# Patient Record
Sex: Male | Born: 1945 | Race: White | Hispanic: No | Smoking: Never smoker
Health system: Southern US, Community
[De-identification: ages and names within clinical notes are randomized; demographics above are authoritative.]

## PROBLEM LIST (undated history)

## (undated) DIAGNOSIS — K219 Gastro-esophageal reflux disease without esophagitis: Secondary | ICD-10-CM

## (undated) DIAGNOSIS — I219 Acute myocardial infarction, unspecified: Secondary | ICD-10-CM

## (undated) DIAGNOSIS — F329 Major depressive disorder, single episode, unspecified: Secondary | ICD-10-CM

## (undated) DIAGNOSIS — R011 Cardiac murmur, unspecified: Secondary | ICD-10-CM

## (undated) DIAGNOSIS — F419 Anxiety disorder, unspecified: Secondary | ICD-10-CM

## (undated) DIAGNOSIS — E785 Hyperlipidemia, unspecified: Secondary | ICD-10-CM

## (undated) DIAGNOSIS — M4800 Spinal stenosis, site unspecified: Secondary | ICD-10-CM

## (undated) DIAGNOSIS — I639 Cerebral infarction, unspecified: Secondary | ICD-10-CM

## (undated) DIAGNOSIS — I10 Essential (primary) hypertension: Secondary | ICD-10-CM

## (undated) DIAGNOSIS — G629 Polyneuropathy, unspecified: Secondary | ICD-10-CM

## (undated) DIAGNOSIS — F32A Depression, unspecified: Secondary | ICD-10-CM

## (undated) DIAGNOSIS — E119 Type 2 diabetes mellitus without complications: Secondary | ICD-10-CM

## (undated) DIAGNOSIS — R42 Dizziness and giddiness: Secondary | ICD-10-CM

## (undated) DIAGNOSIS — K449 Diaphragmatic hernia without obstruction or gangrene: Secondary | ICD-10-CM

## (undated) HISTORY — DX: Gastro-esophageal reflux disease without esophagitis: K21.9

## (undated) HISTORY — DX: Dizziness and giddiness: R42

## (undated) HISTORY — DX: Hyperlipidemia, unspecified: E78.5

## (undated) HISTORY — DX: Cerebral infarction, unspecified: I63.9

## (undated) HISTORY — DX: Anxiety disorder, unspecified: F41.9

## (undated) HISTORY — DX: Type 2 diabetes mellitus without complications: E11.9

## (undated) HISTORY — DX: Depression, unspecified: F32.A

## (undated) HISTORY — DX: Acute myocardial infarction, unspecified: I21.9

## (undated) HISTORY — DX: Major depressive disorder, single episode, unspecified: F32.9

## (undated) HISTORY — DX: Cardiac murmur, unspecified: R01.1

## (undated) HISTORY — DX: Essential (primary) hypertension: I10

## (undated) HISTORY — DX: Polyneuropathy, unspecified: G62.9

## (undated) HISTORY — DX: Spinal stenosis, site unspecified: M48.00

---

## 1978-04-27 HISTORY — PX: APPENDECTOMY: SHX54

## 2001-04-21 ENCOUNTER — Emergency Department (HOSPITAL_COMMUNITY): Admission: EM | Admit: 2001-04-21 | Discharge: 2001-04-21 | Payer: Self-pay | Admitting: Emergency Medicine

## 2006-04-27 DIAGNOSIS — I219 Acute myocardial infarction, unspecified: Secondary | ICD-10-CM

## 2006-04-27 DIAGNOSIS — I639 Cerebral infarction, unspecified: Secondary | ICD-10-CM

## 2006-04-27 HISTORY — DX: Cerebral infarction, unspecified: I63.9

## 2006-04-27 HISTORY — DX: Acute myocardial infarction, unspecified: I21.9

## 2006-10-14 ENCOUNTER — Inpatient Hospital Stay (HOSPITAL_COMMUNITY): Admission: EM | Admit: 2006-10-14 | Discharge: 2006-10-15 | Payer: Self-pay | Admitting: Emergency Medicine

## 2006-10-14 ENCOUNTER — Encounter (INDEPENDENT_AMBULATORY_CARE_PROVIDER_SITE_OTHER): Payer: Self-pay | Admitting: Neurology

## 2006-10-15 ENCOUNTER — Ambulatory Visit: Payer: Self-pay | Admitting: Vascular Surgery

## 2006-10-15 ENCOUNTER — Encounter (INDEPENDENT_AMBULATORY_CARE_PROVIDER_SITE_OTHER): Payer: Self-pay | Admitting: Neurology

## 2006-11-06 ENCOUNTER — Inpatient Hospital Stay (HOSPITAL_COMMUNITY): Admission: EM | Admit: 2006-11-06 | Discharge: 2006-11-08 | Payer: Self-pay | Admitting: Emergency Medicine

## 2006-11-10 ENCOUNTER — Inpatient Hospital Stay (HOSPITAL_COMMUNITY): Admission: EM | Admit: 2006-11-10 | Discharge: 2006-11-13 | Payer: Self-pay | Admitting: Emergency Medicine

## 2006-11-10 ENCOUNTER — Ambulatory Visit: Payer: Self-pay | Admitting: Cardiology

## 2007-01-12 ENCOUNTER — Ambulatory Visit: Payer: Self-pay | Admitting: Cardiology

## 2007-08-31 ENCOUNTER — Ambulatory Visit: Payer: Self-pay | Admitting: Cardiology

## 2007-09-15 ENCOUNTER — Ambulatory Visit: Payer: Self-pay

## 2009-06-20 IMAGING — CT CT HEAD W/O CM
1 of 2 series · 13 of 30 positions shown, 17 images · IV contrast (agent unspecified)
Comparison: 11/06/06

CLINICAL DATA: 60 year-old with seizure.  Stroke.  Recurrent right-sided weakness and tremors.  Slurred speech.  Complains of headaches, TIA on [REDACTED].
HEAD CT WITHOUT CONTRAST:
TECHNIQUE: Contiguous axial images were obtained from the base of the skull through the vertex according to standard protocol without contrast.

[Series 2: brain · axial · 0.47mm/px · z∈[+123,+260]mm · 13 of 32 slices shown, 17 images]
[im 3/32  brain]
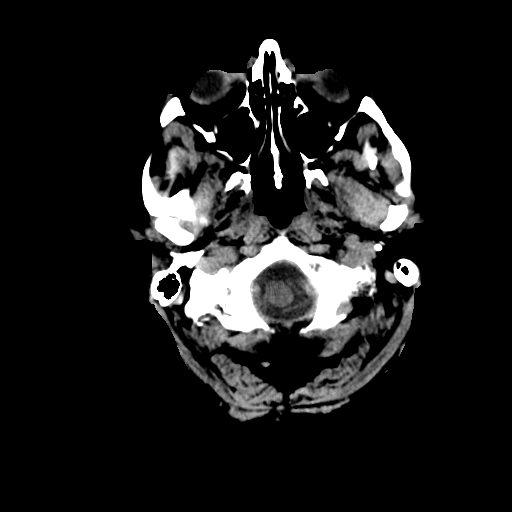
[im 3/32  bone]
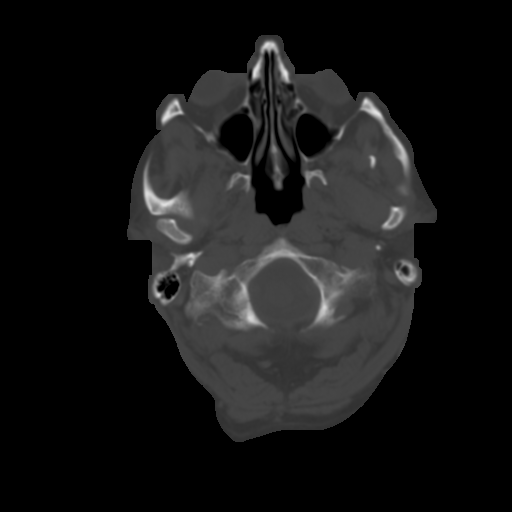
[im 5/32  brain]
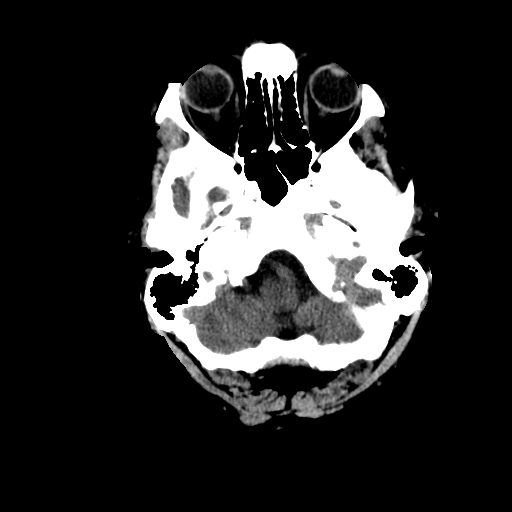
[im 7/32  brain]
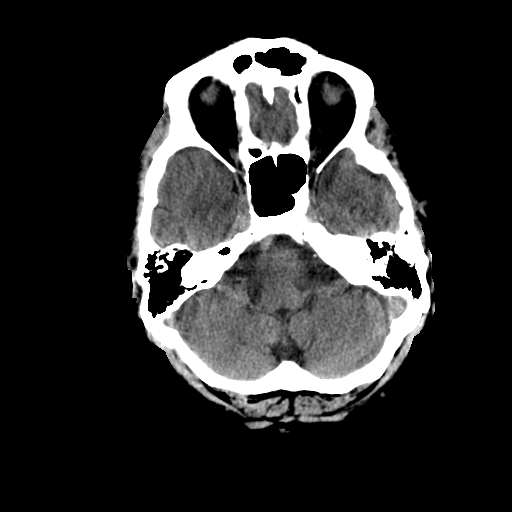
[im 9/32  brain]
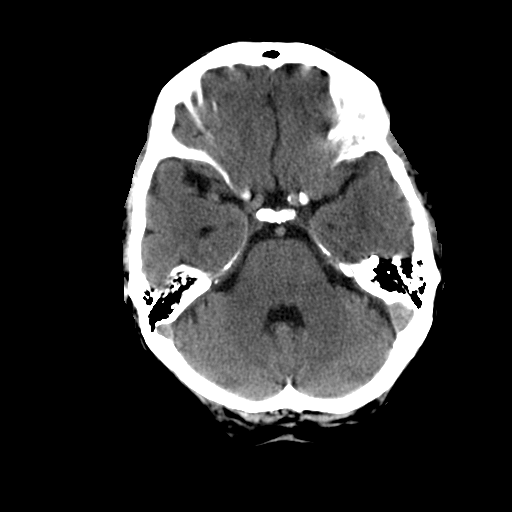
[im 12/32  brain]
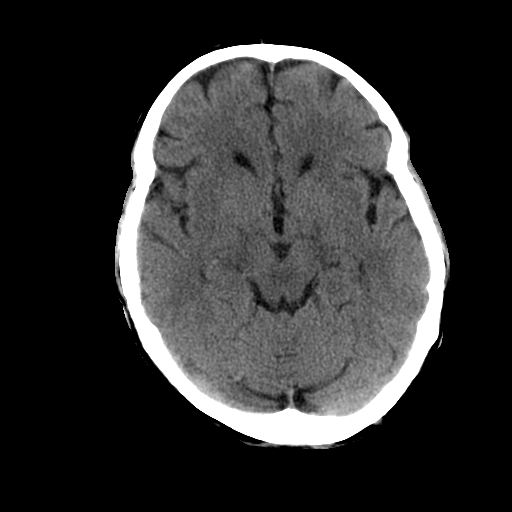
[im 12/32  bone]
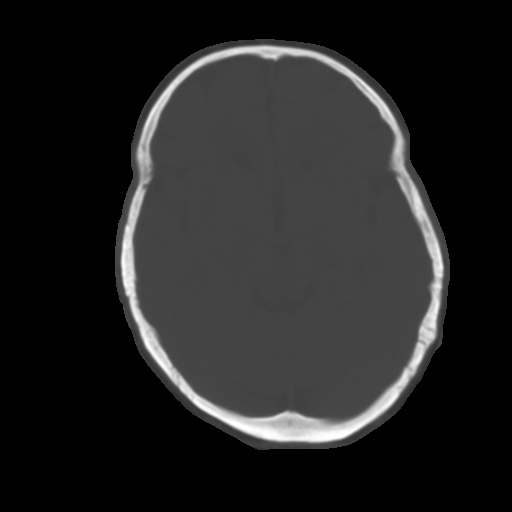
[im 14/32  brain]
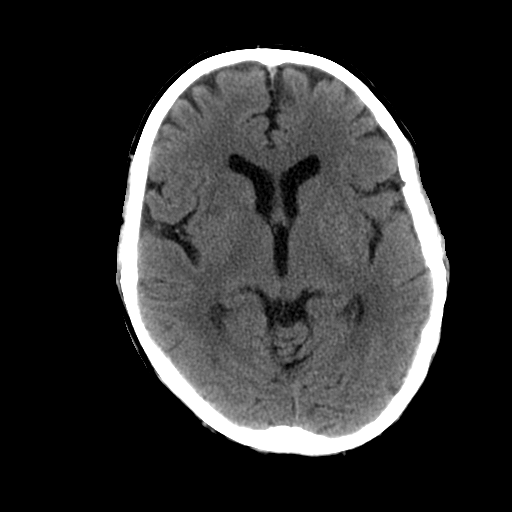
[im 16/32  brain]
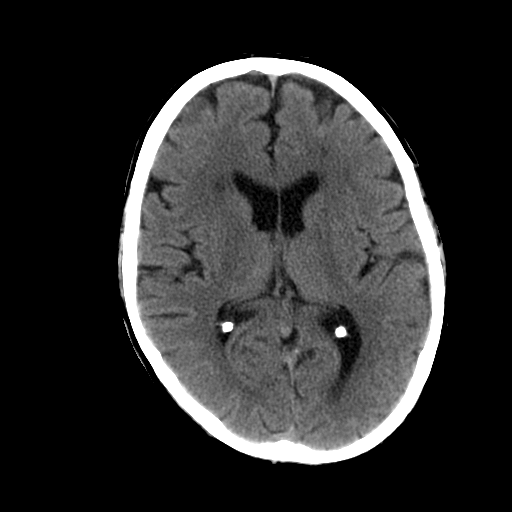
[im 18/32  brain]
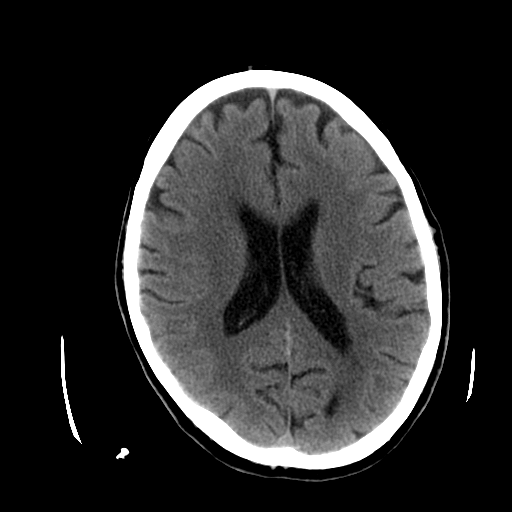
[im 20/32  brain]
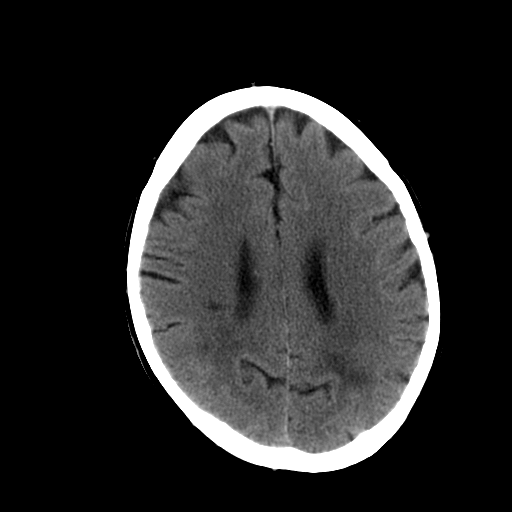
[im 20/32  bone]
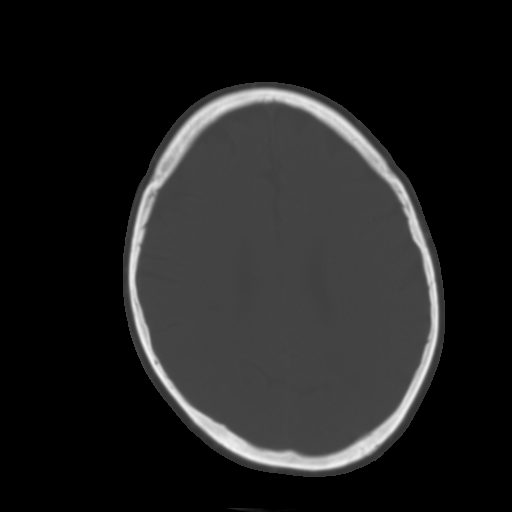
[im 23/32  brain]
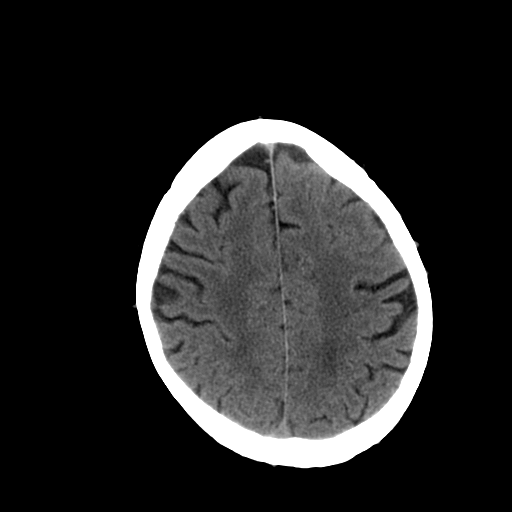
[im 25/32  brain]
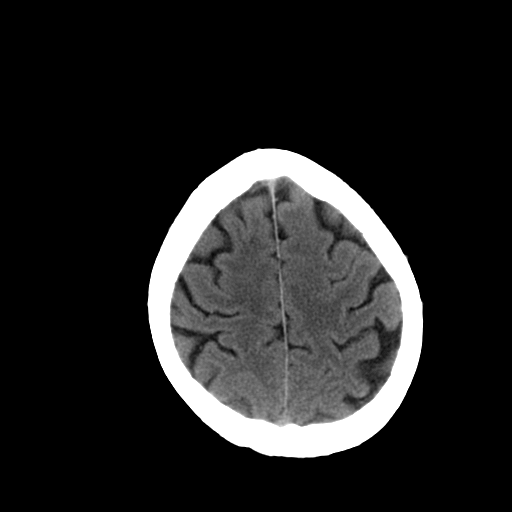
[im 27/32  brain]
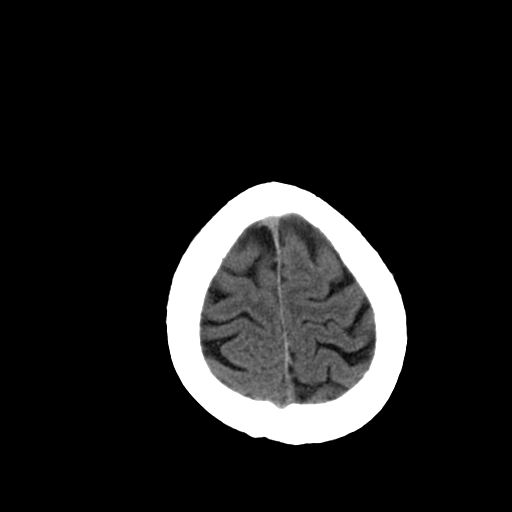
[im 29/32  brain]
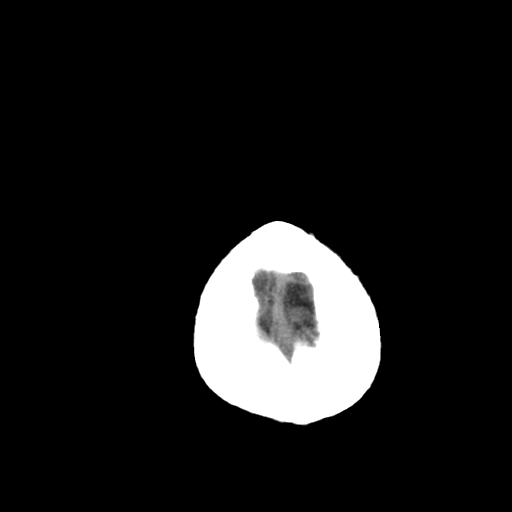
[im 29/32  bone]
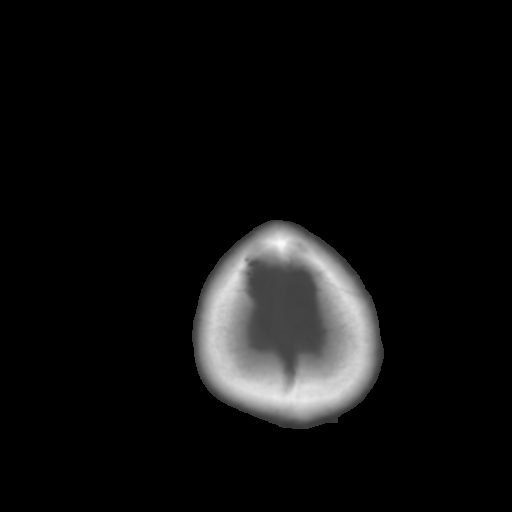

[13 of 30 positions shown; findings below may reference images not displayed]

FINDINGS: There is no intra- or extraaxial fluid collection or mass.  The basilar cisterns and ventricles have a normal appearance.  There are periventricular white matter changes.  Old infarcts are seen within the white matter within the occipital lobe on the left and the parietal lobe on the right.  Low density is seen within the right external capsule, chronic.  o CT evidence for acute infarction.
IMPRESSION: 1.  Chronic changes.  
2.  No evidence for acute intracranial abnormality.

## 2010-05-17 ENCOUNTER — Encounter: Payer: Self-pay | Admitting: Neurology

## 2010-09-09 NOTE — Discharge Summary (Signed)
NAMEKYON, Albert [name] NO.:  [id number]   MEDICAL RECORD NO.:  [id number]          PATIENT TYPE:  INP   LOCATION:  3707                         FACILITY:  [facility]   PHYSICIAN:  Evie Lacks, MD       DATE OF BIRTH:  1946-02-07   DATE OF ADMISSION:  10/14/2006  DATE OF DISCHARGE:                               DISCHARGE SUMMARY   DISCHARGE DIAGNOSES:  1. Left brain transient ischemic attack.  2. Dyslipidemia.  3. Hypertension x 6 years.  4. Essential tremor.  5. Appendectomy 1980.   DISCHARGE MEDICATIONS:  1. Benicar 20 mg a day.  2. Aspirin 325 mg a day.  3. Inderal 20 mg 3 times a day.  4. Zocor 20 mg q.h.s.  5. Hydrochlorothiazide 12.5 mg a day.   STUDIES PERFORMED.:  1. CT of the brain on admission showed no acute abnormalities.      Atrophy with age-advanced small vessel disease and vertebral artery      atherosclerosis.  Subtle possibly low density and right subicular      region.  2. MRI of the brain shows no acute abnormalities.  Chronic ischemic      changes in the white matter.  3. MRA of the head is negative.  4. Chest x-ray shows borderline cardiomegaly.  5. EKG shows normal sinus rhythm.  6. A 2-D echocardiogram shows EF of 55-60%, with no left ventricular      regional wall motion abnormalities.  No embolic source.  7. Carotid Doppler shows no ICA stenosis.  8. EEG shows no seizure activity.   LABORATORY STUDIES:  Cardiac enzymes negative x2.  Cholesterol 169,  triglycerides 102, HDL 38, LDL 111.  Urinalysis negative.  Homocysteine  pending.  Alcohol level less than 5.  Urine drug screen negative.  Hemoglobin A1c 6.1.  Code stroke panel with CBC normal diff,  normal  chemistry with potassium 3.1, glucose 265, albumin 2.5.  Liver function  tests otherwise normal state.  CK 157, CK-MB 4.6, and troponin I of  0.12.   HISTORY OF PRESENT ILLNESS:  This is the first [place] admission for  Mr. Summer Parthasarathy, a 65 year old, right-handed,  divorced, Caucasian male  from [place], [place], who was seen in the emergency room for a  code stroke.  He has a known 6-year history of hypertension.  He does  not smoke cigarettes and has no known risk factors of diabetes or  coronary artery disease.  He has a family history of coronary artery  disease.  Over the last 6 months, he has noted some difficulty with  recurrent chest pain and has had some vague symptoms in his right hand  and arm.  He has been seen by Dr. Anne Hahn in the past for this as well  and has an essential tremor for which he takes propranolol 20 b.i.d.  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  He was there Monday,  Tuesday, and Wednesday for an evaluation.  There he was noted to have a  high blood pressure of 170/20 for 2 days and that decreased to 160/101  yesterday.  He rented a car and drove himself home.  He was in Dr.  Dorinda Hill Moore's office at approximately 9:30 the morning of admission  when he noticed a blood pressure 160/90 and the onset of numbness  involving the right side of his face and upper shoulder.  This was  associated with speech difficulties.  He was seen by Dr. Christell Constant and a  Code Stroke was called.  He was brought by ambulance to [facility]  emergency room where he had speech difficulties as well as numbness on  his right side.  He had no other significant signs or symptoms.  His NIH  stroke scale was 2.  He was not a t-PA candidate secondary to mild  symptom and quick resolution.  He was admitted to the hospital for  further stroke workup.   HOSPITAL COURSE:  MRI was negative for acute stroke.  He was found to  have risk factors of mild dyslipidemia with an LDL of 111.  He was  started on 20 mg of Zocor for this.  The patient also complained of  headache and his blood pressure was slightly elevated in the 150s over  90s in the hospital.  He was started on  hydrochlorothiazide 12.5 daily  as well as his Inderal was increased to t.i.d. which will also happens  tremors as well as blood pressure.  His stroke workup was otherwise  negative.  He had no physical or neurological disabilities and was felt  safe to discharge home.   CONDITION ON DISCHARGE:  The patient alert, oriented x3.  Follows  commands.  Visual fields full.  Moves all extremities.  Does have  essential tremor.   DISCHARGE/PLAN:  1. Discharge home with family.  2. Aspirin for stroke prevention.  3. New Statin for elevated LDL.  4. Will need followup with Dr. Christell Constant within 4-6 weeks.  5. New hydrochlorothiazide for slightly elevated blood pressure.  6. Increase propranolol to 20 t.i.d. for essential tremor.   FOLLOW UP:  Dr. Christell Constant within 1 month.   FOLLOW UP:  Dr. Melbourne Abts in 2-3 months.      Annie Main, N.P.    ______________________________  Evie Lacks, MD    SB/MEDQ  D:  10/15/2006  T:  10/15/2006  Job:  [id number]   cc:   Ernestina Penna, M.D.

## 2010-09-09 NOTE — Discharge Summary (Signed)
NAME:  Albert [name], Albert [name]                 ACCOUNT NO.:  [id number]   MEDICAL RECORD NO.:  [id number]          PATIENT TYPE:  INP   LOCATION:  3035                         FACILITY:  [facility]   PHYSICIAN:  Pramod P. Pearlean Brownie, MD    DATE OF BIRTH:  05-Jan-1946   DATE OF ADMISSION:  11/06/2006  DATE OF DISCHARGE:  11/08/2006                               DISCHARGE SUMMARY   DIAGNOSES AT TIME OF DISCHARGE:  1. Likely migraine with underlying psychosocial stresses.  Doubt      transient ischemic attack.  2. Hypertension.  3. Benign essential tremor.  4. History of smoking.  5. Possible transient ischemic attack in June of 2008, though      conversion reaction preferred.  6. Dyslipidemia.  7. History of appendectomy.  8. Questionable seizure per family history.  Onset of stroke symptoms      July 2008.   MEDICATIONS AT TIME OF DISCHARGE:  1. Benicar 20 mg a day.  2. Aspirin 325 mg a day.  3. Inderal 20 mg three times a day.  4. Zocor 20 mg at bedtime.  5. Hydrochlorothiazide 12.5 mg a day.   STUDIES PERFORMED:  1. CT of the brain shows no acute abnormality.  2. Chest x-ray negative.  3. Hip x-ray negative.  4. Right shoulder x-ray negative.  5. CT of the head with 24-hour follow-up shows chronic changes; no      acute abnormality.  6. Telemetry shows sinus bradycardia.  7. MRI negative for acute stroke.  8. MRA with minor atherosclerotic changes.   LABORATORY STUDIES:  CBC normal.  Chemistry normal.  Coagulations  normal.  Liver function tests normal.  Cardiac enzymes negative.  Alcohol level less than 5.   HISTORY OF PRESENT ILLNESS:  Albert [name] is a 65 year old right-handed  Caucasian male who was discharged from [facility] on October 15, 2006 for  right-sided numbness and weakness possibly secondary to TIA, though most  likely conversion reaction.  He was brought into the emergency room by  EMS for a code stroke.  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  The exact nature of the seizure was  unclear, but when EMS arrived, the patient was not moving his right  side.  He was brought to the emergency room and a Code Stroke was called  in 7:40.  The patient was taken to a CT scanner at 8:00 a.m., and  neurologic evaluation was done at 8:05.  CT results were also known at  8:05 which did not show any changes.  By the time of the initial  evaluation, the patient was able to partially move his right side with  slowly regaining his speech.  He did not recollect any episode, but  complained of headache, right shoulder and leg pain and tremulousness.  He  denied chest pain, shortness of breath.  He was oriented to person,  place and time and could follow commands.   On further questioning of the family within the next 20 minutes, history  was obtained that was different from original history where they stated  the patient was at a baby shower at his granddaughter's place where he  was sitting in a chair watching TV.  His last witnessed normalcy was at  5:45 p.m. on November 06, 2006, but nobody noticed him to be moving his arms  or legs.  At 6:45 p.m., he was sitting and staring at the TV in a  similar pattern, but was unresponsive to any commands.  EMS was called.  There was a history of generalized shaking, especially in his arms more  than the legs, but no witnesses were currently available to give an  actually history.  He was admitted to the hospital for further stroke  evaluation.   HOSPITAL COURSE:  MRI was negative for acute stroke.  It is doubted that  this was a TIA secondary to recent stroke workup being unrevealing.  Possibly migraine etiology with or without underlying psychosocial  stresses and possible  conversion reaction.  EEG was ordered, but the  patient refused.  He was placed on Keppra for secondary prevention in  the hospital, but that was not needed at discharge.  Will continue  aspirin for stroke prevention and have asked him to evaluate his stress  and possible relaxation exercises.  The patient with no neurologic  deficits at time of discharge.   CONDITION AT DISCHARGE:  Neurologic exam normal.   DISCHARGE PLAN:  1. Discharge home with family.  2. Continue aspirin for stroke prevention.  3. Follow up with Dr. Rudi Heap for risk factor control including      stress alleviation.  4. Neurologic follow-up only as needed.      Annie Main, N.P.    ______________________________  Sunny Schlein. Pearlean Brownie, MD    SB/MEDQ  D:  11/08/2006  T:  11/08/2006  Job:  [id number]   cc:   Albert [name], M.D.

## 2010-09-09 NOTE — Consult Note (Signed)
NAMEABDALLAH, Albert [name] NO.:  [id number]   MEDICAL RECORD NO.:  [id number]          PATIENT TYPE:  INP   LOCATION:  2010                         FACILITY:  [facility]   PHYSICIAN:  Jesse Sans. Wall, MD, FACCDATE OF BIRTH:  [birth year]   DATE OF CONSULTATION:  11/11/2006  DATE OF DISCHARGE:                                 CONSULTATION   CARDIOLOGY CONSULTATION:   PRIMARY CARDIOLOGIST:  [place], Dr. Juanito Doom.   PRIMARY CARE PHYSICIAN:  Drs. Moore and Dr. __________.   REASON FOR CONSULTATION:  Chest pain.   HISTORY OF PRESENT ILLNESS:  This is a 65 year old Caucasian male,  looking older than stated age, who while sitting in M.D. office for  followup with primary care physician after recent discharge on July 14  for TIA and seizure activity, starting having sharp chest pains, which  he described as midsternal, 8/10, with associated shortness of breath  and mild dizziness.  He denied syncope, diaphoresis, nausea or vomiting.  He was seen by the M.D. in the office, given 4 baby aspirin and  sublingual nitroglycerin.  The pain eased off some, but remained.  EMS  called, and via EMS, the patient was given another sublingual  nitroglycerin and transported to [facility] emergency room.  On arrival  to [facility] emergency room, the patient was given a third sublingual  nitroglycerin, and pain was relieved.  The patient has had recurrent  chest discomfort since admission.  One woke him up this morning, which  he described as some mild pressure.  He also apparently experienced a  seizure last evening, staring off into space and unresponsive with  some mild tremors noted, and was seen by rapid response.  The patient  was not given any medication at that time.  EKG showed normal sinus  rhythm.  The patient was given nitroglycerin secondary to a blood  pressure 158/94.  The patient recovered after approximately 2 minutes  and was back to normal state.  He is being followed by neurology  as  well, and had seen him in the past hospitalization over the last week.  The patient is now without acute distress.   REVIEW OF SYSTEMS:  Positive for chest pain, shortness of breath, mild  dyspnea on exertion.  Also, complains of lower extremity pain on  occasions.  Otherwise, negative.   PAST MEDICAL HISTORY:  1. Questionable seizures.  2. Migraines.  3. Hypertension.  4. Dyslipidemia.  5. Benign essential tremor.   PAST SURGICAL HISTORY:  Appendectomy.   SOCIAL HISTORY:  The patient lives in [place] with his fiance.  He is a  former [employer] architect, is now disabled.  He does not smoke.  He does not  drink alcohol.  He denies any illicit drug use.  He exercises very  little, and is on no special diet.   FAMILY HISTORY:  Mother died of heart trouble.  Father died at age 57  with continued angina and heart trouble.  He has one sister who died of  colon cancer, and another sister who has currently, but is currently  alive.   CURRENT MEDICATIONS:  1. Aspirin 324 one p.o. daily.  2. Lovenox 40 mg subcu q.24 hours.  3. Protonix 40 mg once a day.  4. Albuterol inhaler 2.5 mg q.6 hours.  5. Atrovent inhaler 0.5 mg q.6 hours.  6. Avapro 150 mg a day.  7. Zocor 20 mg q.p.m.  8. Inderal 20 mg b.i.d.  9. Hydrochlorothiazide 12.5 mg daily.  10.Tylenol p.r.n.  11.Oxycodone p.r.n.  12.Nitroglycerin p.r.n.   ALLERGIES:  DARVON, causing nausea and vomiting, and PENICILLIN, causing  severe nausea.   CURRENT LABORATORY DATA:  Hemoglobin 13.3, hematocrit 38.8, white blood  cells 7.9, platelets 267.  Sodium 132, potassium 3.8, chloride 99, CO2  of 27, BUN 12, creatinine 0.82, glucose 87.  BNP 79.0.  TSH 3.145.  CK  42, MB 0.5, troponin 0.01 and 0.02 respectively.  PTT 37, PT 13.1, INR  1.0.  Cholesterol 143, lipids 85, HDL 40, LDL 76.   EKG revealing sinus bradycardia with a ventricular rate of 45 beats per  minute without evidence of acute ST-T wave abnormalities or prior  ischemic  event.   Chest x-ray dated November 10, 2006 revealing no active cardiopulmonary  disease.   MRA without contrast, stable.  Small vessel disease.  MRA with contrast,  stable, with no acute bleed.   PHYSICAL EXAMINATION:  VITAL SIGNS:  Blood pressure 113/84, pulse 67,  respirations 18, temperature 97.0, O2 sat 100% on 2 liters.  HEENT:  Head is normocephalic, atraumatic.  Eyes:  PERRLA.  Mucous  membranes and mouth:  Pink and moist.  Tongue is midline.  He is  edentulous.  NECK:  Supple.  There is no JVD.  No carotid bruits.  No thyromegaly is  noted.  CARDIOVASCULAR:  Regular rate and rhythm without murmurs, rubs or  gallops.  Pulses are 2+ and equal bilaterally.  LUNGS:  Essentially clear to auscultation with some left lower lobe  crackles noted on inspiration.  ABDOMEN:  Soft, nontender with normal bowel sounds.  No rebound or  guarding is noted.  CHEST WALL:  On palpation caused pain, which was reproducible with  sternal pressure, although this was mild pressure.  NEURO:  The patient does have a mild tremor.  He is a good historian,  and there is no slurred speech or weakness noted.   IMPRESSION:  1. Atypical chest pain, probable musculoskeletal and reproducible with      palpation.  2. Bradycardia, probably related to Inderal.  Recommend titrating down      to 10 mg b.i.d. x2 days, and then discontinue.  Neuro to find an      alternative medication not causing bradycardia.  3. Positive cardiovascular risk factors, to including hypertension,      dyslipidemia, family history and age.   PLAN:  This is a 65 year old Caucasian male, looking older than stated  age, with complaints of sharp substernal chest pressure with associated  shortness of breath in physician's office after a followup appointment  for a recent hospitalization for seizures and TIA.  The patient has been  seen and examined by myself and Dr. Juanito Doom.  Our plan will be:  1. To decrease his Inderal secondary to  bradycardia, and have neuro      find another migraine medication.  2. The patient will be followed as an outpatient for a stress Myoview      once he has been cleared by neuro for other reasons for seizures.      The patient  does have cardiovascular risk factors, but his blood      pressure and cholesterol appear to be stable.  An EKG is normal      with the exception of bradycardia, and troponins are also normal.   We will follow the patient along with you, and thank you for the  recommendation.  Based on hospital course and the patient's response, at  this time, no further cardiac evaluation is necessary and can be done as  an outpatient.      Bettey Mare. Lyman Bishop, NP      Jesse Sans. Daleen Squibb, MD, [facility]  Electronically Signed    KML/MEDQ  D:  11/11/2006  T:  11/11/2006  Job:  [id number]   cc:   Ernestina Penna, M.D.

## 2010-09-09 NOTE — H&P (Signed)
[name], HARTLAND NO.:  [id number]   MEDICAL RECORD NO.:  [id number]          PATIENT TYPE:  INP   LOCATION:  2010                         FACILITY:  [facility]   PHYSICIAN:  Lonia Blood, M.D.      DATE OF BIRTH:  [birth year]   DATE OF ADMISSION:  11/10/2006  DATE OF DISCHARGE:                              HISTORY & PHYSICAL   PRIMARY CARE PHYSICIAN:  Dr. Rudi Heap at [facility].   PRESENTING COMPLAINT:  Chest pain.   HISTORY OF PRESENT ILLNESS:  Patient is a 65 year old gentleman who was  just discharged 2 days ago with diagnosis of TIA and possible migraine  headaches from the hospital.  He went to follow up with Dr. Christell Constant today  and had a chest pain.  Patient described the chest pain as centrally  located.  It lasted a few minutes and then returned.  He did not take  any nitroglycerin until arriving in the ER.  He denied any diaphoresis.  No radiation.  Denied any prior chest pain.  Patient reported that he  has been told that he had a previous MI, but no records available.  He  was referred and his primary care physician sent him to the ER for  further workup.  Patient's workup has, so far, been negative for acute  MI; however, he has risk factors for cardiac disease, including  hypertension, dyslipidemia, prior TIAs and male sex.  We are  subsequently called to admit patient for further workup.   PAST MEDICAL HISTORY:  Significant for:  1. Multiple transient ischemic attacks, at least twice.  2. Hypertension.  3. Dyslipidemia.  4. Benign essential tremors.  5. Status post appendectomy.  6. 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  7. Questionable seizure mainly historically.   ALLERGIES:  HE IS ALLERGIC TO:  1. PENICILLIN.  2. CODEINE.   MEDICATIONS:  Include:  1. Benicar 20 mg daily.  2. Aspirin 325 mg daily.  3. Inderal 20 mg t.i.d.  4. Zocor 20 mg daily.  5. Hydrochlorothiazide 12.5 mg daily.   SOCIAL HISTORY:  Patient is  married and lives with his wife in [place].  Patient denied ever smoking.  Social alcohol, although previously drank  a little bit more than normal.  Denied any IV drug use.   FAMILY HISTORY:  Patient reported possible family history in his father,  where he has no recollection.   REVIEW OF SYSTEMS:  A 12-point review of systems performed is negative,  except per HPI.   PHYSICAL EXAMINATION:  VITAL SIGNS:  He is afebrile with a temperature  of 97.0.  Blood pressure 139/81.  Pulse 50-52.  Respiratory rate 24.  Saturations 98% on room air.  GENERALLY:  He is an awake, alert and pleasant man in no acute distress.  HEENT:  PERRL.  EOMI.  NECK:  Supple.  No JVD.  No  lymphadenopathy.  RESPIRATORY:  Patient has good air entry bilaterally.  No wheezes.  No  rales.  CARDIOVASCULAR SYSTEM:  He has S1, S2.  No murmurs.  ABDOMEN:  Soft, nontender with  positive bowel sounds.  EXTREMITIES:  Show no edema, cyanosis or clubbing.  NEURO EXAM:  Essentially within normal limits.   LABORATORY DATA:  Sodium 138, potassium 4.3, chloride 108, BUN 14,  glucose 96, bicarb of 26.8, creatinine 1.0.  White count 7.9, hemoglobin  13.3, platelet count 267.  Initial cardiac enzymes all negative.  Chest  x-ray showed no active cardiopulmonary disease.  EKG showed sinus  bradycardia with a rate of 45.  No ST/T wave changes.  No change from  prior EKG on July 12.   ASSESSMENT:  This is a 65 year old gentleman with risk factors for  cardiac disease, presenting with what appears to be noncardiac chest  pain.  Due to patient's risk factors, however, we will admit the patient  for further evaluation and rule out myocardial infarction.   PLAN:  1. Chest pain.  As indicated, we will admit the patient to telemetry      bed.  We will check serial cardiac enzymes.  We will continue with      his home medications.  We will give him nitroglycerin as needed for      return of chest pain.  Oxygen, morphine or Dilaudid as  needed.  If      patient's enzymes x3 were negative, we will refer him back to      cardiology for stress testing and further risk stratification.  2. Hypertension.  Blood pressure seems to be controlled on his home      medication.  We will continue with that as much as possible.  3. Sinus bradycardia.  Patient's sinus bradycardia seems to be severe      and he is on Inderal.  I will change his Inderal to b.i.d. at this      point and probably even hold completely if he becomes symptomatic.  4. Benign essential tremor.  Again, I will continue with the Inderal,      but only b.i.d. due to his sinus bradycardia, although he has not      been very much symptomatic at this point.  5. Dyslipidemia.  I will continue with his Zocor while in the      hospital.  Otherwise, further treatment will depend on his response      while in the hospital.      Lonia Blood, M.D.  Electronically Signed     LG/MEDQ  D:  11/10/2006  T:  11/11/2006  Job:  [id number]

## 2010-09-09 NOTE — Procedures (Signed)
EEG NUMBER:  [id number]   REQUESTING PHYSICIAN:  Dr. Avie Echevaria.   CLINICAL HISTORY:  This is a routine EEG done with photic stimulation  but not hyperventilation.  The patient is described as awake and asleep.  This is a 65 year old male with right-sided numbness and right-sided  headache, as well as speech problems.  EEG is for evaluation of possible  seizure.   DESCRIPTION:  The dominant rhythms tracing is a moderate amplitude alpha  rhythm of 10 to 11 Hz, which predominates posteriorly, appears without  abnormal asymmetry, and attenuates with eye opening and closing.  Fairly  abundant low-amplitude fast activity is seen frontally and centrally and  appears without abnormal asymmetry.  No focal slowing is noted.  No  epileptiform discharges seen.  Early in the recording, sleep is recorded  as evidenced by appearance of vertex waves and sleep spindles.  No  abnormalities are seen in the sleep state.  Photic stimulation produced  just driving responses.  Hyperventilation was not performed.  A single  channel devoted EKG revealed marked sinus bradycardia throughout, with a  rate of approximately 48 beats per minute.   CONCLUSIONS:  Marked sinus bradycardia incidentally noted on EKG  tracing.  Otherwise normal study in the awake and sleep states.      Michael L. Thad Ranger, M.D.  Electronically Signed     [id number]  D:  10/15/2006 17:18:28  T:  10/16/2006 11:04:27  Job #:  [id number]

## 2010-09-09 NOTE — Procedures (Signed)
EEG [id number].   CLINICAL HISTORY:  The patient is a 65 year old with a history of  syncope and possible seizure activity.  The study is being done to look  for the presence of seizures.  He has had stroke-like behaviors without  MRI findings (780.2).   PROCEDURE:  The tracing is carried out on a 32 channel digital Cadwell  recorder, reformatted into 16 channel montages with one devoted to EKG.  The patient was awake and drowsy.  The International 10-20 system lead  placement was used.  Medications include morphine, heparin, Ventolin,  Atrovent, Avapro, Zocor, Inderal, hydrochlorothiazide and Benicar.   DESCRIPTION OF FINDINGS:  Dominant frequency is a 10 Hz 15 to 30  microvolt alpha range activity.  When the patient is fully awake, the  background is basically alpha and beta range components.  With  drowsiness, mixed frequency theta activity of 30 to 35 microvolts was  seen.  The patient did not drift into natural sleep.  With arousal, he  returns to a waking rhythm.  Photic stimulation induced a driving  response at 7, 9 and 11 Hz.  Hyperventilation was not carried out.   EKG showed a sinus bradycardia with ventricular response of 48 beats per  minute.   IMPRESSION:  Normal EEG with the patient awake and drowsy.      Deanna Artis. Sharene Skeans, M.D.  Electronically Signed     ZOX:WRUE  D:  11/11/2006 20:48:41  T:  11/12/2006 14:39:02  Job #:  [id number]   cc:   Lonia Blood, M.D.

## 2010-09-09 NOTE — Consult Note (Signed)
NAMEBRODRIC, SCHAUER NO.:  [id number]   MEDICAL RECORD NO.:  [id number]          PATIENT TYPE:  INP   LOCATION:  2010                         FACILITY:  [facility]   PHYSICIAN:  Deanna Artis. Hickling, M.D.DATE OF BIRTH:  March 04, 1946   DATE OF CONSULTATION:  11/11/2006  DATE OF DISCHARGE:                                 CONSULTATION   CHIEF COMPLAINT:  Possible seizure activity.   HISTORY OF THE PRESENT CONDITION:  Albert [name] is a 65 year old  gentleman known to our service from two consecutive hospitalizations  June 19 and 20, 2008 and July 12-14, 2008. During the first  hospitalization, the patient complained of numbness involving the right  side of his face and upper shoulder associated with speech difficulties.  He had a blood pressure of 160/90 and was transferred from Dr. Varney Baas office to [facility] where his symptoms began to  subside.   Dr. Sandria Manly evaluated him and noted that the patient had evidence of  significant small vessel white matter disease involving the left greater  than right parietal regions.  It was thought that he might have  previously had a left brain stroke, although this was unknown.   The patient had a thorough evaluation including MRI and MRA which failed  to show evidence of acute stroke or significant hemodynamically  significant occlusions.  His 2-D echocardiogram and carotid Doppler  similarly failed to show a source for stroke.  EEG was carried out and  was negative.  We did not have a history of previous seizures in this  patient.  Stroke panel revealed mild dyslipidemia with an LDL of 111. He  was started on Zocor 20 mg. He also had a mildly elevated blood pressure  and was placed on hydrochlorothiazide 12.5 mg. The patient was on  Inderal for essential tremor and this was increased to 20 mg 3 times  daily.  He was discharged home on aspirin, statin, hydrochlorothiazide  and propranolol.   The patient presented  to the emergency room on July 12 with complaints  of seizure.  The history was conflicting that he was at a baby shower at  his granddaughter's sitting in a chair watching TV. He was last known to  be normal at 5:45 p.m. At 6:45 he was sitting staring at the TV in a  similar pattern but unresponsive to commands and apparently had some  generalized shaking especially in his arms rather than legs, however, it  is not clear whether this was his tremor or was a seizure.   He was admitted to the hospital for observation.  His workup included CT  scan of the brain, repeat MRI and MRA, x-ray of the right shoulder, hip  and chest.  Plans were made to perform an EEG, but the patient refused.  He had been placed on Keppra for seizure prevention but this was  discontinued.  It was thought that he might have a functional problem  and therefore he was not sent home on antiepileptic medicines.   The patient was admitted last night with complaints of chest  pain.  He  had an EKG showing bradycardia in the 40s but has otherwise been  asymptomatic.  He was found in the chair by his bed confused and  disoriented to the situation.  He has no memory for the event. The rapid  response team was called, his EKG was in sinus rhythm, cardiac enzymes  were unremarkable.  He was given nitroglycerin for a blood pressure  154/94. His blood pressure dropped to 115/70. He complained of a  headache.  On arrival, the patient was disoriented and confused. He  could state his name and where he was but he could not tell me why he  was in the hospital.  He then complained of crushing chest pain, 7/10  and was treated with a nitroglycerin as noted above.  By 2003 hours, the  patient was alert and oriented x3 with no signs of postictal depression.   Nursing note suggests that the patient had severe tremors and  unresponsiveness.  The nursing note mentioned that he had  questionable  seizures 2-3 minutes after the nurses  arrival in the room.  Again is not  clear to me whether what was being seen was a patient that was poorly  responsive with essential tremor.  In this setting, we were asked to see  the patient to sort out his condition and make recommendations for  further workup and treatment.   The patient has been seen by the [facility] cardiology team and they  believed that his bradycardia is directly related to Inderal and has  recommended tapering and discontinuing it.  This may cause significant  problems for his essential tremor.   REVIEW OF SYSTEMS:  Remarkable for headaches which could be migrainous  in nature.  They are left frontal associated with pounding pain, some  nausea,  sensory to light.  He says that he has had them intermittently  over the past couple of weeks.  They do not seem to be any different on  higher doses of propranolol.   PAST MEDICAL HISTORY:  As noted above for hypertension, dyslipidemia and  benign essential tremor.  It is not clear that he has had seizures nor  that his headaches are definitely migrainous in nature.   REVIEW OF SYSTEMS:  Positive for chest pain, shortness of breath,  dyspnea on exertion, palpitations and dizziness.  The patient has  heaviness in his legs.  He has some pain in his legs.  He denies fever,  chills, sweats, change in weight. He denies coryza, hematemesis, stiff  neck or other symptoms related to head and neck disease. He has not had  nausea, vomiting, or diarrhea. No frequency, urgency, dysuria, or  hematuria. No diabetes or thyroid disease, no allergic or any  conditions. Neurologic review of systems see above. No other symptoms  except those noted in the history of the present illness. A 12-system  review is otherwise negative.   PAST SURGICAL HISTORY:  Appendectomy.   FAMILY HISTORY:  His mother died of heart trouble.  Father had angina,  heart trouble and died in his 81s. One of his sisters died of colon  cancer, another is living  with cancer that he thinks maybe colon but he  does not know.   SOCIAL HISTORY:  The patient lives in [place]. He has a fiance who has a  seizure disorder and is a patient in our practice. He is sedentary, he  does not use tobacco.  He used to drink in the past but  does not drink  now.  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   CURRENT MEDICATIONS:  1. Aspirin 325 mg daily.  2. Lovenox 40 mg every 24 hours.  3. Protonix 40 mg daily.  4. Albuterol 2.5 mg every 6 hours.  5. Atrovent 0.5 mg every 6 hours.  6. Avapro 150 mg daily.  7. Zocor 20 mg daily.  8. Inderal has been dropped to 20 mg twice a day and is on its way to      be tapered.  9. Hydrochlorothiazide 12.5 mg daily.   P.R.N. MEDICINES:  Tylenol, oxycodone and nitroglycerin.   DRUG ALLERGIES:  None known. He has an intolerance to Darvon and  penicillin, both of which cause nausea. The Darvon also causes vomiting.   PHYSICAL EXAMINATION:  On examination today, this is a pleasant  gentleman in no acute distress.  Temperature 97.1, resting pulse 67, respirations 18, blood pressure  113/84, oxygen saturation 98% on 2 liters.  HEENT:  No signs of infections.  He has wax in his left external  auditory canal.  I cannot see his tympanic membranes.  NECK:  He has a supple neck, full range of motion.  No cranial or  cervical bruits.  LUNGS:  Clear to auscultation.  HEART:  No murmurs.  Pulses normal.  ABDOMEN:  Soft, nontender.  Bowel sounds normal.  EXTREMITIES:  Without edema.  NEUROLOGIC:  Awake, alert, attentive, appropriate.  He has mild  stuttering, no dysarthria.  No dysphasia, dyspraxia. Cranial nerves  round, reactive pupils. Visual fields full to double simultaneous  stimuli.  Extraocular movements full and conjugate.  Symmetric facial  strength, midline tongue and uvula. Air conduction greater than bone  conduction bilaterally.  MOTOR EXAMINATION:  The patient has mild  giveaway strength on the right  side but when I encouraged him he shows normal strength.  He has no  pronator drift.  He has good fine motor movements in opposing his thumbs  with his fingers.  He also had good rapid repetitive movements.  Sensation shows a hypesthesia on the right side but splits the midline.  He also senses a tuning fork as more prominent on the right than left  which is a functional complaint.  Stereognosis is equal on both sides.  He also had good vibratory and proprioceptive sense that was equal in  his fingers and toes.  Deep tendon reflexes were symmetric but virtually  absent.  The patient had bilateral flexor plantar responses.  I did not  have him get up and walk because of his recent problems with chest pain.   IMPRESSION:  1. Transient alteration of awareness, 780.02.  2. Benign essential tremor 333.1.  3. History of transient ischemic attack-like events and seizure-like      events that may be functional.   PLAN:  We need to air on the side of caution in this gentleman.  I think  that we should repeat his EEG and if it is negative, I would not place  him on antiepileptic medicines.  I think that tapering his Inderal is  reasonable.  One alternative that could be substituted which might help  both headaches and tremor would be Topamax which we started a dose of 25  mg twice daily. I prefer to allow the Inderal to be tapered and watch  his essential tremor before adding any medications.  If he had seizures,  Topamax also would be a  reasonable medication.  The major side effect is  cognitive blunting which is problematic.  I could not give him Depakote  which would be helpful for his headaches but would make his tremor  worse.  I am very reluctant to put him on any antiepileptic medicine if  I do not have a clear-cut abnormality on EEG.  The episodes though  fairly frequent (July 12 and then again July 16) are still far enough  apart that performing a  prolonged video telemetry EEG might not be  useful over 72 hours.  Would serve this patient without treatment, have  him follow-up with Dr. Lesia Sago who has seen him previously in our  office in 2-3 months' time.  If he has further periods of altered mental  status, then at that time we may elect to place him on preventative  medicine and would take into account use of Topamax versus Keppra which  might not help his tremor or his headaches but would not interfere with  other medications that he uses for stroke prophylaxis and his heart.  I  appreciate the opportunity to participate in his care.  Addendum:  The  EEG was normal in the waking state and drowsiness.      Deanna Artis. Sharene Skeans, M.D.  Electronically Signed    WHH/MEDQ  D:  11/11/2006  T:  11/12/2006  Job:  [id number]   cc:   Lonia Blood, M.D.

## 2010-09-09 NOTE — Assessment & Plan Note (Signed)
[facility]                            CARDIOLOGY OFFICE NOTE   THORNE, WIRZ                        MRN:          [id number]  DATE:08/31/2007                            DOB:          1946-01-22    PRIMARY:  Lindaann Pascal, [facility].   REASON FOR PRESENTATION:  Evaluate a patient with chest pain.   HISTORY OF PRESENT ILLNESS:  The patient is 65 years old.  He was last  seen by [place] in July of 2008.  He was hospitalized with chest pain.  This  was felt to be atypical but he was to get a Cardiolite after discharge.  He did not have this done because of money problems.  He now is referred  because he has continued to have this chest discomfort.  He describes a  substernal discomfort.  It happens with emotional stress.  It can go up  to his right shoulder.  It is a 6/10 in intensity at its peak.  He may  be a little nauseated with this.  It goes away spontaneously but may  last for several minutes.  He is very limited in his activities because  of a gait disturbance.  He walks with a walker.  He does get short of  breath with this but denies any chest discomfort with this activity.  He  has not had any PND or orthopnea.  He has had no palpitations,  presyncope or syncope.   PAST MEDICAL HISTORY:  Pseudoseizures, migraines, hypertension,  dyslipidemia, benign essential tremor, appendectomy.   ALLERGIES:  PENICILLIN, CODEINE, DIOVAN.   CURRENT MEDICATIONS:  1. Atenolol 50 mg daily.  2. Zoloft 25 mg b.i.d.  3. Lasix 20 mg daily.  4. Zocor 40 mg daily.  5. Fenofibrate 160 mg daily.  6. Benicar 40/25 daily.  7. Aspirin 325 mg daily.  8. Mucinex 600 mg b.i.d.  9. Azithromycin.   REVIEW OF SYSTEMS:  As stated in the HPI, otherwise negative for other  systems.   PHYSICAL EXAMINATION:  GENERAL:  The patient is in no distress.  VITAL SIGNS:  Blood pressure 94/62, heart rate 57 and regular, weight  201 pounds.  HEENT:   Eyelids unremarkable.  Pupils equal, round and reactive to  light.  Fundi not visualized.  Oral mucosa unremarkable.  NECK:  No jugular venous distention at 45 degrees.  Carotid upstroke  brisk and symmetrical.  No bruits, no thyromegaly.  LYMPHATICS:  No cervical, axillary, inguinal adenopathy.  LUNGS:  Clear to auscultation bilaterally.  BACK:  No costovertebral angle tenderness.  CHEST:  Unremarkable.  HEART:  PMI not displaced or sustained, S1and S2 within normal limits,  no S3, no S4, no clicks, no rubs, no murmurs.  ABDOMEN:  Mildly obese, positive bowel sounds, normal in frequency and  pitch, no bruits, rebound, guarding or midline pulsatile mass.  No  hepatomegaly, no splenomegaly.  SKIN:  No rashes, no nodules.  EXTREMITIES:  2+ pulses throughout, no edema, no cyanosis, no clubbing.  NEURO:  Oriented to person, place, time.  Cranial nerves II-XII grossly  intact, motor grossly intact throughout.   EKG sinus rhythm, rate 61, axis within normal limits, intervals within  normal limits, no acute ST-T wave changes.   ASSESSMENT AND PLAN:  1. Chest discomfort.  The patient's chest discomfort is somewhat      atypical.  However, he does have some cardiovascular risk factors      and will need to be screened with a stress perfusion study as he      cannot ambulate on a treadmill.  This will be an adenosine      perfusion study.  Further evaluation will be based on these      results.  2. Hypertension.  Blood pressure is actually very low currently.  He      might be able to back off on his back Benicar if it remains this      low but I will defer to [place].  3. Dyslipidemia.  This is followed by Ignacia Bayley.  His LDL is      100, HDL 42.  He will remain on the medicines as listed.  4. Followup.  I will see the patient back based on the results of the      above.     Rollene Rotunda, MD, [facility]  Electronically Signed    JH/MedQ  DD: 08/31/2007  DT: 08/31/2007   Job #: [id number]   cc:   Lindaann Pascal, PA

## 2010-09-09 NOTE — H&P (Signed)
NAMEDESMEN, SCHOFFSTALL NO.:  [id number]   MEDICAL RECORD NO.:  [id number]          PATIENT TYPE:  EMS   LOCATION:  MAJO                         FACILITY:  [facility]   PHYSICIAN:  Genene Churn. Love, M.D.    DATE OF BIRTH:  13-May-1945   DATE OF ADMISSION:  10/14/2006  DATE OF DISCHARGE:                              HISTORY & PHYSICAL   This is the first [facility] admission for Mr. Dontreal Miera, a  65 year old, right-handed, white, divorced male from [place], [place], seen in the emergency room for a code stroke.   HISTORY OF PRESENT ILLNESS:  Mr. Schnackenberg has a known 6-year history of  hypertension.  He does not smoke cigarettes and has no known risk  factors of diabetes mellitus, coronary artery disease, or  hyperlipidemia.  He has a positive family history of coronary artery  disease.  Over the last 6 months, he has noted some difficulty with  recurrent chest pain and has had some vague symptoms in his right hand  and arm.  He has been seen by Dr. Anne Hahn in the past for this as well as  essential tremor for which he has been on propranolol 20 mg b.i.d.  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  He was  there Monday, Tuesday and Wednesday, June 16, 17, and 18, with an  evaluation.  He was noted to have high blood pressure in the 170/120  range for 2 days that went to 160/101 yesterday, and he rented a car and  drove himself home.  He was in Dr. Dorinda Hill Moore's office at  approximately 9:30 this morning.  When he noted a blood pressure of  about 160/90 and noted the onset of numbness involving the right side of  his face and upper shoulder.  This was associated with speech  difficulties.  He was seen by Dr. Christell Constant and a code stroke was called.  He was brought by ambulance to [facility] emergency room.  He  had speech difficulties as well as numbness and his  speech difficulties  began to clear.  In the emergency room, he had some residual numbness  but really no other symptoms or signs.   PAST MEDICAL HISTORY:  Significant for:  1. Hypertension.  2. Appendectomy in 1980s.  3. Benign essential tremor.   MEDICATIONS:  1. Benicar 20 mg per day.  2. Inderal 20 mg b.i.d.  3. Aspirin one p.o. daily.   He has history of allergies to:  1. PENICILLIN having increase shakes.  2. DARVON causing nausea and vomiting.   SOCIAL HISTORY:  He finished the 8th grade in school.  He has worked as  a Naval architect for 37 years, formerly worked for [employer] and was  being evaluated at [employer] in [place] when he  developed the high blood pressure.   MEDICAL REVIEW OF SYSTEMS:  Remarkable for intermittent chest pain over  the last 6 months.  FAMILY HISTORY:  His mother died at age unknown, unknown cause.  His  father died at age 64 from myocardial infarction.  He has one sister.  He has a son in his 18s, and daughters in their 64s and 30s.  Two  daughter in their 25s and 30s are living and well.   PHYSICAL EXAMINATION:  GENERAL:  Revealed a well-developed, very anxious  male.  VITAL SIGNS:  Blood pressure right arm was 160/80, left arm was 170/90,  heart rate was 64.  NECK:  He had no bruits.  The neck was supple.  MENTAL STATUS:  He was alert and oriented x3.  His cranial nerve  examination revealed visual fields to be full, except at times he saw  monocular diplopia in either eye.  The extraocular movements were full.  Corneals were present.  Hearing was decreased subjectively on the right  as compared to the left.  Pinprick was decreased on the right side of  his face versus the left.  There was no facial motor asymmetry.  He had  also decreased vibration in the right forehead versus the left forehead.  Tongue was midline. The uvula was midline.  Gags were present.  Initially, he said he could not shrug his shoulders but  then he was able  to shrug his shoulders quite well.  His motor examination revealed 5/5  strength in the upper and lower extremities.  There is some giving way  phenomenon right hand.  He complained of pain on moving his right arm  and his right leg.  He had subjective decreased pinprick in the right  face, arm and leg.  Deep tendon reflexes were 1 and 2+.  Plantar  responses were downgoing.  GENERAL:  Revealed tympanic membranes clear.  The lungs were clear.  He  had no heart murmur.  Bowel sounds were normal.  And, he was not  circumcised.  There was no cyanosis, clubbing or edema in the  extremities.   His NIH stroke scale was a score of 2 with 1 for getting the wrong date  and a 1 for subjective alteration in sensation on the right.  His CT  scan showed old right frontal and old left occipital strokes.   IMPRESSION:  1. Left brain transient ischemic attack, code 425.9, versus stroke,      code 434.01, with NIH stroke scale of 2.  At this time he is not a      candidate for TPA because of his rapid improvement in speech and      his low NIH stroke scale score which is now at about a 1, since he      does know the month.  2. Hypertension, code 796.2.  3. 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  4. Essential tremor, code 333.1.  5. History of chest pain.   PLAN:  1. At this time is to admit the patient for further evaluation      including cardiac isoenzymes.  2. MRI and MRA will be obtained.  3. An EEG because of history of episodic right-sided arm dysfunction      will also be obtained because he does have an old stroke in the      left parietal occipital region.           ______________________________  Genene Churn. Sandria Manly, M.D.     JML/MEDQ  D:  10/14/2006  T:  10/14/2006  Job:  [id number]   cc:   Suella [place].  Christell Constant, M.D.

## 2010-09-09 NOTE — Discharge Summary (Signed)
NAMEANTHONNY, Albert [name] NO.:  [id number]   MEDICAL RECORD NO.:  [id number]          PATIENT TYPE:  INP   LOCATION:  2010                         FACILITY:  [facility]   PHYSICIAN:  Lonia Blood, M.D.       DATE OF BIRTH:  [birth year]   DATE OF ADMISSION:  11/10/2006  DATE OF DISCHARGE:  11/13/2006                               DISCHARGE SUMMARY   The patient's primary care physician Dr. Rudi Heap with [name].   DISCHARGE DIAGNOSES:  1. Chest pain, the patient ruled out myocardial infarction.  2. Pseudoseizures.  3. Hypertension.  4. Hyperlipidemia.  5. Benign essential tremor.  6. Status post splenectomy.  7. Conversion disorder.   DISCHARGE MEDICATIONS:  1. Benicar 20 mg daily.  2. Aspirin 325 mg daily.  3. Inderal 20 mg three times a day.  4. Zocor 20 mg daily.  5. Hydrochlorothiazide 12.5 mg daily.  6. Klonopin 0.5 mg twice a day as needed for anxiety.   CONDITION ON DISCHARGE:  [name] was discharged in good condition.  At  the time of the discharge the patient was encouraged to follow up with  Dr. Riley Kill from cardiology.  The patient will also follow up with his  primary care physician as previously scheduled.  The patient was given  the phone number of Behavioral Health to arrange outpatient counseling  if he desires.   CONSULTATION DURING THIS ADMISSION:  The patient was seen by Dr. Daleen Squibb  from cardiology and Dr. Sharene Skeans from neurology as well as Dr. Jeanie Sewer  from psychiatry.   PROCEDURES DURING THIS ADMISSION:  1. November 10, 2006, The patient underwent chest x-ray, findings of no      active cardiopulmonary process.  2. November 11, 2006, EEG, findings of normal EEG.   HISTORY AND PHYSICAL:  For admission history and physical, refer to the  dictated H&P done by Dr. Mikeal Hawthorne November 10, 2006.   HOSPITAL COURSE:  Problem 1.  CHEST PAIN:  [name] was admitted to a telemetry unit.  He  had 3 sets of cardiac enzymes, which  were all within normal limits.  The  patient's chest pain resolved but it occasionally will come back.  The  patient was seen in consultation by the [facility] group, and  they have arranged for an outpatient stress test.  The patient was  discharged on aspirin and a beta blocker, and he will follow up for the  outpatient stress test with the [facility] group.   Problem 2.  EPISODES OF ACUTE-ONSET WEAKNESS AND TREMORS:  [name] was  witnessed during one of these episodes.  They are consistent with  pseudoseizures.  Dr. Sharene Skeans from neurology has seen the patient and he  did not find evidence of epilepsy or seizure-like activity in this  patient.  We suspect that the patient's episodes may be related to  conversion disorder.  The patient was encouraged to seek outpatient  counseling if he desires.   Problem 3.  HYPERLIPIDEMIA AND HYPERTENSION:  This has been stable  without _the need to  alter the chronic medications.      Lonia Blood, M.D.  Electronically Signed     SL/MEDQ  D:  11/15/2006  T:  11/16/2006  Job:  [id number]   cc:   [name] C. Wall, MD, [facility]

## 2010-09-09 NOTE — Consult Note (Signed)
[name], [name] NO.:  [id number]   MEDICAL RECORD NO.:  [id number]          PATIENT TYPE:  INP   LOCATION:  2010                         FACILITY:  [facility]   PHYSICIAN:  Antonietta Breach, M.D.  DATE OF BIRTH:  23-Aug-1945   DATE OF CONSULTATION:  11/12/2006  DATE OF DISCHARGE:  11/13/2006                                 CONSULTATION   REASON FOR CONSULTATION:  Rule out somatoform condition.   HISTORY OF PRESENT ILLNESS:  Albert [name] is a 65 year old male  admitted to the [facility] on November 10, 2006.   [name] has had the new onset of chest pain along with some speech  difficulty.  The patient had complained of numbness on the right side of  his face and upper torso.  The patient underwent neurological evaluation  including an MRI and MRA.  These did not show evidence of  cerebrovascular cause.  His echocardiogram and Doppler studies were  negative.  EEG was negative.   The patient had also complained of generalized convulsive activity which  had been reported by a family member.   Further organic workup was negative.   The night before last, the patient was admitted with chest pain.  He  initially presented with disorientation and confusion reported in the  medical record.  Nursing noted that the patient was having severe tremor  and unresponsiveness.   At the time of the undersigned visit the patient is completely oriented.  His memory function is intact.  He is socially appropriate.  He has  normal interests and constructive goals.  He does not have depressed  mood.  He has no thoughts of harming himself or others.  He has no  delusions or hallucinations.   PAST PSYCHIATRIC HISTORY:  No history of mania.  No history of  hallucinations or delusions.  No history of suicide attempts.  No  history of significant anxiety symptoms.  No history of psychiatric  care.   FAMILY PSYCHIATRIC HISTORY:  None known.   SOCIAL HISTORY:  [name] is  single.  He is a Teacher, adult education.  [place]  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  He denies that  there is any increased stress or performance pressure with the new  company.  He denies any new stresses on the job at all.  He has a fiance  who has a seizure disorder.  The patient does have her remote history of  drinking alcohol, but does not currently drink.  He does not use any  illegal drugs.  Of note, the neurological consultant, Dr. 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   PAST MEDICAL HISTORY:  Please see the above.  There is a history of  hypertension, dyslipidemia and benign essential tremor.   LABORATORY AND TEST DATA:  As above.   MEDICATIONS:  The MAR is reviewed.  The the patient is on Klonopin 0.5  mg b.i.d. for feeling on edge.   REVIEW OF SYSTEMS:  Noncontributory.   PHYSICAL EXAMINATION:  VITAL SIGNS:  Temperature 98.5, pulse 62,  respirations 18, blood pressure 119/79, O2 saturation on room air 94%.  GENERAL:  [name] is a middle-aged male sitting up in his hospital bed  in no distress.  He has no abnormal involuntary movements.  He has no  abnormal body habitus.  He is well-groomed.   OTHER MENTAL STATUS EXAMINATION:  ([name] does have a normal  attention span.  His concentration is within normal limits.  He is  oriented to all spheres.  His memory is intact to immediate recent and  remote.  His speech involves normal rate and prosody without dysarthria.  Thought process is logical, coherent, goal-directed.  No looseness of  associations.  Abstraction and calculation abilities are intact.  His  language expression and comprehension are intact.  Thought content:  No  thoughts of harming himself.  No thoughts of harming others, no  delusions, no hallucinations.  Affect is slightly  anxious at baseline,  but with a broad appropriate response.  His mood is within normal  limits.  His insight is partial.  His judgment is intact   The patient does not describe any difficulties on the job which is in  conflict with the report given to Dr. Sharene Skeans of neurology.   ASSESSMENT:  AXIS I:  Rule out somatoform disorder not otherwise  specified.  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  AXIS II:  Deferred AXIS III:  See general medical problems.  AXIS IV:  Occupational.  AXIS V:  55.   Mr. Beem is not assessed to be at risk to harm himself or others.  He  agrees to use emergency services immediately for any psychiatric  emergency symptoms.   DISCUSSION:  [name] does provide some evidence that he may be  repressing involving his reaction to recent occupational stress.  It is  noted that his fiance has a reported neurologic condition.   Therefore, it is possible that the patient has been developing excessive  repression leading to compromise symptom formation and conversion.   RECOMMENDATIONS:  The undersigned recommended that the patient enter  psychotherapy to help resolve his symptoms.  However, the patient  declines at this time,  He will reconsider it.   If the patient does agree to psychotherapy, would have him see a PhD  psychologist or psychiatrist of his choice in the community for  psychotherapy regarding somatoform symptoms.      Antonietta Breach, M.D.  Electronically Signed     JW/MEDQ  D:  11/18/2006  T:  11/19/2006  Job:  [id number]

## 2010-09-09 NOTE — H&P (Signed)
NAMEAADYN, [name] NO.:  [id number]   MEDICAL RECORD NO.:  [id number]          PATIENT TYPE:  INP   LOCATION:  3035                         FACILITY:  [facility]   PHYSICIAN:  Darnelle Bos, MDDATE OF BIRTH:  1946-01-31   DATE OF ADMISSION:  11/06/2006  DATE OF DISCHARGE:                              HISTORY & PHYSICAL   REASON FOR ADMISSION:  Right-sided weakness and code stroke.   HISTORY AND FINDINGS:  Albert [name] is a 65 year old right-handed Caucasian  male who was recently discharged from the hospital on the 20th for right-  sided numbness and weakness secondary to possible transient ischemic  episode.  He was brought into the emergency room today by EMS for a code  stroke.  As per EMS, he was in a motorcycle shop sitting in a chair at  around 6:45 when he was noted to fall to the right and have a  generalized seizure activity lasting for approximately a minute followed  by right-sided weakness, but there was no witnessed prior weakness or  any complaints as per the bystanders.  The exact nature of the seizure  is unclear, but when EMS arrived, the patient was mute and was not  moving his right side.  He was brought into the emergency room and a  code stroke was called at 7:40 p.m.  The patient was taken to the CT  scanner at 8 o'clock and neurological evaluation was done at 8:05.  The  CT results were also known at 8:05 which did not show any acute changes.   By the time of initial evaluation, the patient was able to partially  move the right side and was slowly regaining his speech.  He does not  recollect any episode, but complained of headache, right shoulder and  leg pain and tremulousness.  He denied any chest pain, but complained of  shortness of breath.  He was oriented to person, place and time, to the  month and could follow commands appropriately.   On further questioning of the family within 20 minutes, the history  obtained was different  where they said the patient was at a baby shower  at his grand-daughter's place where he was sitting in a chair watching  TV.  His last witnessed normalcy was at 5:45 p.m. on July 12 where he  was watching TV, but nobody noticed him to be moving his arms or legs.  At 6:45 p.m., he was sitting and staring at the TV in a similar pattern,  but was unresponsive to any commands at which time EMS was called.  There was history of generalized shaking, especially in his arms more  than the legs, but not witnesses are currently available to give an  actual history.   He was recently in the hospital on the 19th with sudden-onset numbness  of the right side of the face and the upper shoulder with some speech  difficulties which cleared in the emergency room.  His stroke evaluation  was negative except for evidence of hyperlipidemia and he was discharged  on aspirin with a normal  neurological examination on the 20th.   PAST MEDICAL HISTORY:  1. Hypertension.  2. Benign essential tremor.  3. History of smoking.  4. Possible TIA.  5. Hyperlipidemia.   PAST SURGICAL HISTORY:  Status post appendectomy.   ALLERGIES:  PREDNISOLONE CAUSES INCREASED SHAKING AND DARVON CAUSES  NAUSEA AND VOMITING.   MEDICATIONS PRIOR TO DISCHARGE:  1. He is on aspirin 325 mg a day.  2. Benicar 20 mg a day.  3. Inderal 20 mg three times a day.  4. Zocor 20 mg at bedtime.  5. Hydrochlorothiazide 12.5 mg a day.   FAMILY HISTORY:  Significant for coronary artery disease.   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  He smokes, but does not drink any  alcohol or use any illicit drugs.  He lives with his wife.   REVIEW OF SYSTEMS:  Currently, patient complains of headache, right  shoulder and leg pain and shortness of breath.  He denies any chest  pain.  In the past few days, the patient has been complaining apparently  per wife of right-sided neck  and shoulder pain which he had during the  prior admission too.  There was no evidence of paresthesias or weakness  or gait difficulties.  There was no evidence of bowel or bladder  problems.  His speech was fine.  There were no problems with swallowing.  There were no episodes of starring or unresponsiveness or falls.   VITALS:  At the time of EMS evaluation, his blood pressure was 138/98,  pulse rate 64, respirations 18, glucose was 103.  In the emergency room,  his blood pressure was 140/114, pulse was 66, respirations were 22 and  saturating 99% on nasal cannula.  His temperature was 97.3.   PHYSICAL EXAMINATION:  GENERAL:  [name] was lying in the bed in no  acute distress.  HEENT:  Normocephalic, atraumatic.  NECK:  Supple.  No carotid bruits.  CARDIOVASCULAR:  Regular rhythm and rate.  LUNGS:  Clear air entry.  EXTREMITIES:  No cyanosis, clubbing or edema, but tremors were noted in  the right arm and leg.  ABDOMEN:  Soft.  NEUROLOGICAL EXAMINATION:  Patient was initially thought to be drowsy,  but his mental status improved and he was alert by 8:30 p.m.  He was  oriented to person, place and time, to the month and year.  He could  name objects and follow simple commands, but had difficulty with  pronunciation of [employer] and had difficulty repeating a sentence.  There was no obvious dysarthria, but stuttering was noted.  There was no  dysphagia.  Cranial nerve examination:  Pupils were 3 mm, reactive to  light.  Extraocular movements were intact. Visual field was intact, but  patient complained that he could see minimally through the right eye.  His optokinetic nystagmus was present bilaterally.  Normal corneal  reflex.  Within 20 minutes, the patient could count fingers in all  visual fields.  No facial asymmetry.  Tongue was midline.  Palate  elevation was symmetric. Facial sensation was intact.  Eye closure was  strong.  Motor evaluation:  Patient has normal tone and  bulk.  The motor  evaluation was inconsistent.  At times, patient was noted to lift up his  arm against gravity, but when asked to do so, he would not do it.  On  assistance, he would lift it up without any drift  and the strength on  the right side in general was graded as 4/5, the left side was 5/5,  though it was inconsistent as mentioned above.  His sensation was normal  to pain.  The patient was inconsistent with evaluation of touch.  He  could do finger-to-nose testing with the left hand and the right hand  with minimal assistance of lifting the arm up at the initial start of  the process.  He could not perform the heel testing bilaterally.  Deep  tendon reflexes were 2+ and symmetric.  Plantars were downgoing.   Gait was not evaluated at this time.   LABORATORY EVALUATION:  His stroke panel showed a hemoglobin of 14,  hematocrit 41.4, WBC was 9.6, platelet count 279, INR was 0.9, PTT as  26, sodium 135, potassium 4, chloride 99, bicarb 28, BUN was 14,  creatinine 0.9, glucose 89, total bilirubin was 0.7, alkaline  phosphatase was 52, AST was 27, ALT was 39, total protein was 6.8,  albumin was 3.8, calcium 9.7, CK total was 83 with MB of 0.7 and  troponin was 0.01.   His CT was reviewed.  There was no evidence of acute infarct or  hemorrhage or mass effect.  There was some small-vessel ischemic changes  bilaterally and this was thought to be unchanged from his prior  evaluation in June.   IMPRESSION:  1. Right-sided weakness and anarthria improving.  The etiology of this      is questionable; it could be a transient ischemic episode versus      postictal from a seizure.  His exam is inconsistent suggesting      organic causes.  2. Right-sided shoulder and hip pain.   PLAN:  1. Admit to stepdown unit for a stroke/seizure workup.  2. Will get an EEG in the morning to evaluate for any ictal or      postictal activity.  3. Will get an MRA of the neck, as the patient apparently  has been      complaining of neck pain with contrast to look for any evidence of      dissection which could be predisposing to embolic transient      ischemic attack or his neck pain.  4. Since he had a total stroke workup recently, will not repeat any of      the stroke workup at this time.  5. Will put him on seizure/fall precautions.  6. DVT prophylaxis.  7. NPO until swallow study.  After that, will start him on 2-gm sodium      diet.  8. Will monitor blood pressures and maintain it less than 180/110.  9. Will consult physical therapy, occupational therapy and speech      therapy.  10.Will check shoulder, hip and chest x-rays.  11.Will do serial cardiac enzymes.  12.The plan was explained to the family.  He was not given TPA, though      he was within the three-hour window and exam was inconsistent,      there was a suspicion of seizure at the onset, and NIH Stroke Scale      improved from 5 to 2 during the examination.  13.The family expresses no questions or concerns at this time.  He      will be continued on aspirin for stroke prophylaxis at this time.      Darnelle Bos, MD  Electronically Signed     RB/MEDQ  D:  11/06/2006  T:  11/07/2006  Job:  [id number]

## 2011-02-09 LAB — PROTIME-INR: INR: 1

## 2011-02-09 LAB — I-STAT 8, (EC8 V) (CONVERTED LAB)
Acid-Base Excess: 1
Bicarbonate: 26.8 — ABNORMAL HIGH
HCT: 41
Hemoglobin: 13.9
Operator id: 288831
Potassium: 4.3
Sodium: 138
TCO2: 28

## 2011-02-09 LAB — LIPID PANEL
Cholesterol: 143
HDL: 50
LDL Cholesterol: 76

## 2011-02-09 LAB — CBC
HCT: 38.8 — ABNORMAL LOW
MCHC: 34.1
MCHC: 34.2
MCV: 89.2
Platelets: 240
RBC: 4.35
RDW: 12.6
WBC: 7.9

## 2011-02-09 LAB — CK TOTAL AND CKMB (NOT AT ARMC)
CK, MB: 0.4
CK, MB: 0.5
Relative Index: INVALID
Total CK: 36
Total CK: 39
Total CK: 42

## 2011-02-09 LAB — POCT I-STAT CREATININE: Operator id: 288831

## 2011-02-09 LAB — POCT CARDIAC MARKERS
Operator id: 288831
Troponin i, poc: <0.05

## 2011-02-09 LAB — URINALYSIS, ROUTINE W REFLEX MICROSCOPIC
Bilirubin Urine: NEGATIVE
Glucose, UA: NEGATIVE
Hgb urine dipstick: NEGATIVE
Protein, ur: NEGATIVE
Specific Gravity, Urine: 1.02

## 2011-02-09 LAB — COMPREHENSIVE METABOLIC PANEL
AST: 23
Albumin: 3.7
Alkaline Phosphatase: 51
BUN: 12
Chloride: 99
GFR calc Af Amer: >60
Potassium: 3.8
Sodium: 132 — ABNORMAL LOW
Total Protein: 6.7

## 2011-02-09 LAB — CARDIAC PANEL(CRET KIN+CKTOT+MB+TROPI)
Relative Index: INVALID
Total CK: 42
Troponin I: 0.01

## 2011-02-09 LAB — BASIC METABOLIC PANEL
BUN: 12
CO2: 27
Calcium: 9
Creatinine, Ser: 0.79
GFR calc non Af Amer: >60
Glucose, Bld: 98
Sodium: 140

## 2011-02-09 LAB — D-DIMER, QUANTITATIVE: D-Dimer, Quant: 0.23

## 2011-02-09 LAB — TSH: TSH: 3.145

## 2011-02-09 LAB — PROLACTIN: Prolactin: 6.2 (ref 2.1–17.1)

## 2011-02-10 DIAGNOSIS — R079 Chest pain, unspecified: Secondary | ICD-10-CM

## 2011-02-10 LAB — DIFFERENTIAL
Basophils Relative: 1
Eosinophils Absolute: 0.1
Eosinophils Relative: 2
Lymphocytes Relative: 43
Lymphs Abs: 3.5 — ABNORMAL HIGH
Monocytes Absolute: 1 — ABNORMAL HIGH
Monocytes Relative: 10
Neutro Abs: 3.9
Neutro Abs: 4.3
Neutrophils Relative %: 48

## 2011-02-10 LAB — PROTIME-INR
INR: 1
Prothrombin Time: 12.8

## 2011-02-10 LAB — CBC
HCT: 41.4
Hemoglobin: 14
MCHC: 34.2
MCV: 89.6
Platelets: 269
RDW: 12.6
RDW: 12.8
WBC: 9.6

## 2011-02-10 LAB — CK TOTAL AND CKMB (NOT AT ARMC)
CK, MB: 0.6
CK, MB: 0.7
Relative Index: INVALID
Total CK: 83

## 2011-02-10 LAB — APTT: aPTT: 26

## 2011-02-10 LAB — COMPREHENSIVE METABOLIC PANEL
ALT: 37
AST: 27
Albumin: 3.8
Alkaline Phosphatase: 52
BUN: 13
BUN: 14
CO2: 29
Calcium: 9.3
Chloride: 99
Creatinine, Ser: 0.94
Creatinine, Ser: 0.95
GFR calc Af Amer: >60
GFR calc non Af Amer: >60
Glucose, Bld: 99
Potassium: 4
Total Protein: 6.8

## 2011-02-10 LAB — ETHANOL: Alcohol, Ethyl (B): <5

## 2011-02-10 LAB — POCT CARDIAC MARKERS
CKMB, poc: <1 — ABNORMAL LOW
Myoglobin, poc: 57.8
Operator id: 151321
Troponin i, poc: <0.05

## 2011-02-10 LAB — TROPONIN I: Troponin I: 0.01

## 2011-02-11 LAB — BLOOD GAS, ARTERIAL
Acid-base deficit: 0.5
O2 Content: 2
O2 Saturation: 99
Patient temperature: 98.6

## 2011-02-11 LAB — LIPID PANEL
HDL: 38 — ABNORMAL LOW
Total CHOL/HDL Ratio: 4.4
Triglycerides: 102
VLDL: 20

## 2011-02-11 LAB — CK TOTAL AND CKMB (NOT AT ARMC)
CK, MB: 0.5
CK, MB: 0.6
CK, MB: 4.6 — ABNORMAL HIGH
Relative Index: 2.9 — ABNORMAL HIGH
Relative Index: INVALID
Relative Index: INVALID
Total CK: 157
Total CK: 48
Total CK: 52

## 2011-02-11 LAB — PROTIME-INR
INR: 0.9
Prothrombin Time: 12.6

## 2011-02-11 LAB — URINALYSIS, ROUTINE W REFLEX MICROSCOPIC
Bilirubin Urine: NEGATIVE
Glucose, UA: NEGATIVE
Hgb urine dipstick: NEGATIVE
Ketones, ur: NEGATIVE
Nitrite: NEGATIVE
Protein, ur: NEGATIVE
Specific Gravity, Urine: 1.012
Urobilinogen, UA: 0.2
pH: 6.5

## 2011-02-11 LAB — CBC
MCHC: 33.4
MCV: 90.4
Platelets: 312
RBC: 4.71
RDW: 13.1

## 2011-02-11 LAB — DIFFERENTIAL
Basophils Absolute: 0
Basophils Relative: 0
Eosinophils Absolute: 0.1
Eosinophils Relative: 1
Lymphocytes Relative: 37
Lymphs Abs: 3.3
Monocytes Absolute: 0.6
Monocytes Relative: 7
Neutro Abs: 4.9
Neutrophils Relative %: 55

## 2011-02-11 LAB — COMPREHENSIVE METABOLIC PANEL WITH GFR
ALT: <8
AST: 16
Albumin: 2.5 — ABNORMAL LOW
Alkaline Phosphatase: 57
BUN: 23
CO2: 30
Calcium: 10.2
Chloride: 98
Creatinine, Ser: 4.11 — ABNORMAL HIGH
GFR calc non Af Amer: 15 — ABNORMAL LOW
Glucose, Bld: 265 — ABNORMAL HIGH
Potassium: 3.1 — ABNORMAL LOW
Sodium: 140
Total Bilirubin: 1
Total Protein: 6

## 2011-02-11 LAB — TROPONIN I: Troponin I: 0.02

## 2011-02-11 LAB — RAPID URINE DRUG SCREEN, HOSP PERFORMED
Benzodiazepines: NOT DETECTED
Cocaine: NOT DETECTED
Tetrahydrocannabinol: NOT DETECTED

## 2011-02-11 LAB — HOMOCYSTEINE: Homocysteine: 7.8

## 2011-02-11 LAB — HEMOGLOBIN A1C: Hgb A1c MFr Bld: 6.1

## 2011-02-11 LAB — APTT: aPTT: 28

## 2011-02-11 LAB — ETHANOL

## 2011-03-05 ENCOUNTER — Encounter: Payer: Non-veteran care | Admitting: Cardiovascular Disease

## 2012-05-16 ENCOUNTER — Ambulatory Visit: Payer: Non-veteran care | Admitting: Physical Therapy

## 2012-05-30 ENCOUNTER — Ambulatory Visit: Payer: Non-veteran care | Attending: Family | Admitting: Physical Therapy

## 2012-05-30 DIAGNOSIS — R279 Unspecified lack of coordination: Secondary | ICD-10-CM | POA: Insufficient documentation

## 2012-05-30 DIAGNOSIS — IMO0001 Reserved for inherently not codable concepts without codable children: Secondary | ICD-10-CM | POA: Insufficient documentation

## 2012-05-30 DIAGNOSIS — R5381 Other malaise: Secondary | ICD-10-CM | POA: Insufficient documentation

## 2012-06-01 ENCOUNTER — Ambulatory Visit: Payer: Non-veteran care | Admitting: Physical Therapy

## 2012-06-06 ENCOUNTER — Ambulatory Visit: Payer: Non-veteran care | Admitting: Physical Therapy

## 2012-06-10 ENCOUNTER — Encounter: Payer: Non-veteran care | Admitting: Physical Therapy

## 2012-06-15 ENCOUNTER — Ambulatory Visit: Payer: Non-veteran care | Admitting: Physical Therapy

## 2012-06-16 ENCOUNTER — Ambulatory Visit: Payer: Non-veteran care | Admitting: Physical Therapy

## 2012-06-20 ENCOUNTER — Ambulatory Visit: Payer: Non-veteran care | Admitting: Physical Therapy

## 2012-06-22 ENCOUNTER — Ambulatory Visit: Payer: Non-veteran care | Admitting: Physical Therapy

## 2012-06-24 ENCOUNTER — Ambulatory Visit: Payer: Non-veteran care | Admitting: Physical Therapy

## 2013-11-29 ENCOUNTER — Telehealth: Payer: Self-pay | Admitting: Nurse Practitioner

## 2013-11-29 NOTE — Telephone Encounter (Signed)
Appt given per patient request 

## 2013-12-07 ENCOUNTER — Encounter: Payer: Self-pay | Admitting: Nurse Practitioner

## 2013-12-07 ENCOUNTER — Ambulatory Visit (INDEPENDENT_AMBULATORY_CARE_PROVIDER_SITE_OTHER): Payer: Commercial Managed Care - HMO | Admitting: Nurse Practitioner

## 2013-12-07 ENCOUNTER — Encounter (INDEPENDENT_AMBULATORY_CARE_PROVIDER_SITE_OTHER): Payer: Self-pay

## 2013-12-07 ENCOUNTER — Ambulatory Visit (INDEPENDENT_AMBULATORY_CARE_PROVIDER_SITE_OTHER): Payer: Commercial Managed Care - HMO

## 2013-12-07 VITALS — BP 127/79 | HR 57 | Temp 97.4°F | Ht 71.0 in | Wt 193.0 lb

## 2013-12-07 DIAGNOSIS — I2510 Atherosclerotic heart disease of native coronary artery without angina pectoris: Secondary | ICD-10-CM | POA: Insufficient documentation

## 2013-12-07 DIAGNOSIS — I209 Angina pectoris, unspecified: Secondary | ICD-10-CM

## 2013-12-07 DIAGNOSIS — E785 Hyperlipidemia, unspecified: Secondary | ICD-10-CM

## 2013-12-07 DIAGNOSIS — I498 Other specified cardiac arrhythmias: Secondary | ICD-10-CM

## 2013-12-07 DIAGNOSIS — I25111 Atherosclerotic heart disease of native coronary artery with angina pectoris with documented spasm: Secondary | ICD-10-CM

## 2013-12-07 DIAGNOSIS — F3289 Other specified depressive episodes: Secondary | ICD-10-CM

## 2013-12-07 DIAGNOSIS — F329 Major depressive disorder, single episode, unspecified: Secondary | ICD-10-CM

## 2013-12-07 DIAGNOSIS — R001 Bradycardia, unspecified: Secondary | ICD-10-CM

## 2013-12-07 DIAGNOSIS — I10 Essential (primary) hypertension: Secondary | ICD-10-CM | POA: Insufficient documentation

## 2013-12-07 DIAGNOSIS — F411 Generalized anxiety disorder: Secondary | ICD-10-CM

## 2013-12-07 DIAGNOSIS — F32A Depression, unspecified: Secondary | ICD-10-CM

## 2013-12-07 DIAGNOSIS — Z125 Encounter for screening for malignant neoplasm of prostate: Secondary | ICD-10-CM

## 2013-12-07 DIAGNOSIS — K219 Gastro-esophageal reflux disease without esophagitis: Secondary | ICD-10-CM

## 2013-12-07 NOTE — Patient Instructions (Signed)
Bradycardia °Bradycardia is a term for a heart rate (pulse) that, in adults, is slower than 60 beats per minute. A normal rate is 60 to 100 beats per minute. A heart rate below 60 beats per minute may be normal for some adults with healthy hearts. If the rate is too slow, the heart may have trouble pumping the volume of blood the body needs. If the heart rate gets too low, blood flow to the brain may be decreased and may make you feel lightheaded, dizzy, or faint. °The heart has a natural pacemaker in the top of the heart called the SA node (sinoatrial or sinus node). This pacemaker sends out regular electrical signals to the muscle of the heart, telling the heart muscle when to beat (contract). The electrical signal travels from the upper parts of the heart (atria) through the AV node (atrioventricular node), to the lower chambers of the heart (ventricles). The ventricles squeeze, pumping the blood from your heart to your lungs and to the rest of your body. °CAUSES  °· Problem with the heart's electrical system. °· Problem with the heart's natural pacemaker. °· Heart disease, damage, or infection. °· Medications. °· Problems with minerals and salts (electrolytes). °SYMPTOMS  °· Fainting (syncope). °· Fatigue and weakness. °· Shortness of breath (dyspnea). °· Chest pain (angina). °· Drowsiness. °· Confusion. °DIAGNOSIS  °· An electrocardiogram (ECG) can help your caregiver determine the type of slow heart rate you have. °· If the cause is not seen on an ECG, you may need to wear a heart monitor that records your heart rhythm for several hours or days. °· Blood tests. °TREATMENT  °· Electrolyte supplements. °· Medications. °· Withholding medication which is causing a slow heart rate. °· Pacemaker placement. °SEEK IMMEDIATE MEDICAL CARE IF:  °· You feel lightheaded or faint. °· You develop an irregular heart rate. °· You feel chest pain or have trouble breathing. °MAKE SURE YOU:  °· Understand these  instructions. °· Will watch your condition. °· Will get help right away if you are not doing well or get worse. °Document Released: 01/03/2002 Document Revised: 07/06/2011 Document Reviewed: 07/19/2013 °[employer]® Patient Information ©2015 [employer]. This information is not intended to replace advice given to you by your health care provider. Make sure you discuss any questions you have with your health care provider. ° °

## 2013-12-07 NOTE — Progress Notes (Signed)
Subjective:    Patient ID: Albert [name], male    DOB: [birth year], 68 y.o.   MRN: [id number]  Hypertension This is a chronic problem. The current episode started more than 1 year ago. The problem is unchanged. The problem is controlled. Associated symptoms include anxiety. Pertinent negatives include no chest pain, headaches, peripheral edema or shortness of breath. Risk factors for coronary artery disease include dyslipidemia and male gender. Past treatments include calcium channel blockers, beta blockers and ACE inhibitors. The current treatment provides significant improvement. Compliance problems include diet and exercise.  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  Hyperlipidemia This is a chronic problem. The current episode started more than 1 year ago. The problem is uncontrolled. He has no history of diabetes or hypothyroidism. Pertinent negatives include no chest pain or shortness of breath. Current antihyperlipidemic treatment includes statins. The current treatment provides moderate improvement of lipids. Risk factors for coronary artery disease include dyslipidemia and male sex.  GERD Patient is taking omeprazole daily- works well- no heartburn Depression/GAD Patient currently on sertraline and xanax- has been anxious and depressed since wife passed away- wants to cut back on xanax. CAD Has had an MI in the past- taking atenolol- Has been released by cardiologist.     Review of Systems  Constitutional: Negative.   HENT: Negative.   Respiratory: Negative for shortness of breath.   Cardiovascular: Negative for chest pain.  Genitourinary: Negative.   Neurological: Negative for headaches.  Psychiatric/Behavioral: Negative.   All other systems reviewed and are negative.      Objective:   Physical Exam  Constitutional: He is oriented to person, place, and time. He appears well-developed and well-nourished.  HENT:  Head: Normocephalic.  Right Ear: External ear  normal.  Left Ear: External ear normal.  Nose: Nose normal.  Mouth/Throat: Oropharynx is clear and moist.  Eyes: EOM are normal. Pupils are equal, round, and reactive to light.  Neck: Normal range of motion. Neck supple. No JVD present. No thyromegaly present.  Cardiovascular: Normal rate, regular rhythm, normal heart sounds and intact distal pulses.  Exam reveals no gallop and no friction rub.   No murmur heard. Pulmonary/Chest: Effort normal and breath sounds normal. No respiratory distress. He has no wheezes. He has no rales. He exhibits no tenderness.  Abdominal: Soft. Bowel sounds are normal. He exhibits no mass. There is no tenderness.  Musculoskeletal: Normal range of motion. He exhibits no edema.  Lymphadenopathy:    He has no cervical adenopathy.  Neurological: He is alert and oriented to person, place, and time. No cranial nerve deficit.  Skin: Skin is warm and dry.  Psychiatric: He has a normal mood and affect. His behavior is normal. Judgment and thought content normal.   BP 127/79  Pulse 57  Temp(Src) 97.4 F (36.3 C) (Oral)  Ht 5' 11"  (1.803 m)  Wt 193 lb (87.544 kg)  BMI 26.93 kg/m2  EKG-Marked Verne Grain, FNP Chest x ray- normal-Preliminary reading by Ronnald Collum, FNP  [facility]        Assessment & Plan:  . 1. Gastroesophageal reflux disease without esophagitis   2. Essential hypertension, benign   3. Hyperlipidemia with target LDL less than 100   4. Depression   5. GAD (generalized anxiety disorder)   6. Coronary artery disease involving native coronary artery of native heart with angina pectoris with documented spasm   7. Screening for prostate cancer   8. Bradycardia    Orders Placed This Encounter  Procedures  . DG Chest 2 View    Standing Status: Future     Number of Occurrences: 1     Standing Expiration Date: 02/06/2015    Order Specific Question:  Reason for Exam (SYMPTOM  OR DIAGNOSIS REQUIRED)    Answer:  screening    Order  Specific Question:  Preferred imaging location?    Answer:  Internal  . CMP14+EGFR  . NMR, lipoprofile  . PSA, total and free  . Ambulatory referral to Cardiology    Referral Priority:  Routine    Referral Type:  Consultation    Referral Reason:  Specialty Services Required    Requested Specialty:  Cardiology    Number of Visits Requested:  1  . EKG 12-Lead   Meds ordered this encounter  Medications  . pravastatin (PRAVACHOL) 40 MG tablet    Sig: Take 40 mg by mouth. TAKE 1/2 TABLET AT HS  . AMLODIPINE BESYLATE PO    Sig: Take 10 mg by mouth daily.  Marland Kitchen lisinopril (PRINIVIL,ZESTRIL) 5 MG tablet    Sig: Take 5 mg by mouth. Take 1/2 daily to protect kidneys  . SERTRALINE HCL PO    Sig: Take 100 mg by mouth. Take one tablet twice daily for anxiety and depression  . atenolol (TENORMIN) 100 MG tablet    Sig: Take 100 mg by mouth daily.  Marland Kitchen ALPRAZolam (XANAX) 0.5 MG tablet    Sig: Take 0.5 mg by mouth. Take one tablet every  8 hours for anxiety  . omeprazole (PRILOSEC) 20 MG capsule    Sig: Take 20 mg by mouth 2 (two) times daily before a meal.    Labs pending Health maintenance reviewed Diet and exercise encouraged Continue all meds Follow up  In 110month   [place] FNP

## 2013-12-08 LAB — CMP14+EGFR
ALBUMIN: 4.4 g/dL (ref 3.6–4.8)
ALT: 47 IU/L — ABNORMAL HIGH (ref 0–44)
AST: 33 IU/L (ref 0–40)
Albumin/Globulin Ratio: 1.6 (ref 1.1–2.5)
Alkaline Phosphatase: 82 IU/L (ref 39–117)
BUN / CREAT RATIO: 13 (ref 10–22)
BUN: 10 mg/dL (ref 8–27)
CHLORIDE: 99 mmol/L (ref 97–108)
CO2: 26 mmol/L (ref 18–29)
CREATININE: 0.8 mg/dL (ref 0.76–1.27)
Calcium: 9.6 mg/dL (ref 8.6–10.2)
GFR calc non Af Amer: 94 mL/min/{1.73_m2} (ref >59)
GFR, EST AFRICAN AMERICAN: 108 mL/min/{1.73_m2} (ref >59)
GLOBULIN, TOTAL: 2.8 g/dL (ref 1.5–4.5)
GLUCOSE: 104 mg/dL — AB (ref 65–99)
Potassium: 4 mmol/L (ref 3.5–5.2)
Sodium: 142 mmol/L (ref 134–144)
TOTAL PROTEIN: 7.2 g/dL (ref 6.0–8.5)
Total Bilirubin: 0.4 mg/dL (ref 0.0–1.2)

## 2013-12-08 LAB — NMR, LIPOPROFILE
CHOLESTEROL: 249 mg/dL — AB (ref 100–199)
HDL Cholesterol by NMR: 59 mg/dL (ref >39)
HDL PARTICLE NUMBER: 33 umol/L (ref >=30.5)
LDL PARTICLE NUMBER: 1773 nmol/L — AB (ref <1000)
LDL Size: 21.5 nm (ref >20.5)
LDLC SERPL CALC-MCNC: 160 mg/dL — AB (ref 0–99)
LP-IR Score: 43 (ref <=45)
Small LDL Particle Number: 637 nmol/L — ABNORMAL HIGH (ref <=527)
TRIGLYCERIDES BY NMR: 151 mg/dL — AB (ref 0–149)

## 2013-12-08 LAB — PSA, TOTAL AND FREE
PSA FREE PCT: 28.6 %
PSA, Free: 0.6 ng/mL
PSA: 2.1 ng/mL (ref 0.0–4.0)

## 2013-12-18 ENCOUNTER — Encounter: Payer: Self-pay | Admitting: Cardiology

## 2013-12-18 ENCOUNTER — Ambulatory Visit (INDEPENDENT_AMBULATORY_CARE_PROVIDER_SITE_OTHER): Payer: Commercial Managed Care - HMO | Admitting: Cardiology

## 2013-12-18 VITALS — BP 124/80 | HR 52 | Ht 71.0 in | Wt 190.0 lb

## 2013-12-18 DIAGNOSIS — I498 Other specified cardiac arrhythmias: Secondary | ICD-10-CM

## 2013-12-18 DIAGNOSIS — R001 Bradycardia, unspecified: Secondary | ICD-10-CM

## 2013-12-18 MED ORDER — ATENOLOL 100 MG PO TABS: 50.0000 mg | ORAL_TABLET | Freq: Every day | ORAL | Status: DC

## 2013-12-18 NOTE — Patient Instructions (Signed)
   Decrease Atenolol to  daily  Continue all other medications.   Your physician has requested that you regularly monitor and record your blood pressure readings at home. Please take readings approximately 2 hours after medication x 2 weeks and return to office for MD review.   Follow up in  1 month

## 2013-12-18 NOTE — Progress Notes (Signed)
Clinical Summary Mr. Causby is a 68 y.o.male seen today as a new patient for the following medical problems.  1. Bradycardia - low heart rates noted by pcp last visit, EKG with sinus bradycardia.  - denies any significant lightheadness or dizziness, no syncope no presyncope. Typically occurs with walking, can feel off balance but denies feeling lightheaded.  - fairly sedentary lifestyle. Highest activity is walking dog regularly approx 20 minutes. Can have some fatigue after which is stable for several year - he is on atenolol 100 mg daily    Past Medical History  Diagnosis Date  . Anxiety   . Depression   . Hypertension   . Hyperlipidemia   . GERD (gastroesophageal reflux disease)      Allergies  Allergen Reactions  . Diovan [Valsartan] Nausea And Vomiting  . Hydrocodone Nausea And Vomiting    Chest discomfort.  . Penicillins     Chest pain     Current Outpatient Prescriptions  Medication Sig Dispense Refill  . ALPRAZolam (XANAX) 0.5 MG tablet Take 0.5 mg by mouth. Take one tablet every  8 hours for anxiety      . AMLODIPINE BESYLATE PO Take 10 mg by mouth daily.      Marland Kitchen atenolol (TENORMIN) 100 MG tablet Take 100 mg by mouth daily.      Marland Kitchen lisinopril (PRINIVIL,ZESTRIL) 5 MG tablet Take 5 mg by mouth. Take 1/2 daily to protect kidneys      . omeprazole (PRILOSEC) 20 MG capsule Take 20 mg by mouth 2 (two) times daily before a meal.      . pravastatin (PRAVACHOL) 40 MG tablet Take 40 mg by mouth. TAKE 1/2 TABLET AT HS      . SERTRALINE HCL PO Take 100 mg by mouth. Take one tablet twice daily for anxiety and depression       No current facility-administered medications for this visit.     No past surgical history on file.   Allergies  Allergen Reactions  . Diovan [Valsartan] Nausea And Vomiting  . Hydrocodone Nausea And Vomiting    Chest discomfort.  . Penicillins     Chest pain      Family History  Problem Relation Age of Onset  . Heart disease Mother     . Heart disease Father      Social History Mr. Comas reports that he has never smoked. He does not have any smokeless tobacco history on file. Mr. Nemetz reports that he does not drink alcohol.   Review of Systems CONSTITUTIONAL: No weight loss, fever, chills, weakness or fatigue.  HEENT: Eyes: No visual loss, blurred vision, double vision or yellow sclerae.No hearing loss, sneezing, congestion, runny nose or sore throat.  SKIN: No rash or itching.  CARDIOVASCULAR: per HPI RESPIRATORY: No shortness of breath, cough or sputum.  GASTROINTESTINAL: No anorexia, nausea, vomiting or diarrhea. No abdominal pain or blood.  GENITOURINARY: No burning on urination, no polyuria NEUROLOGICAL: No headache, dizziness, syncope, paralysis, ataxia, numbness or tingling in the extremities. No change in bowel or bladder control.  MUSCULOSKELETAL: No muscle, back pain, joint pain or stiffness.  LYMPHATICS: No enlarged nodes. No history of splenectomy.  PSYCHIATRIC: No history of depression or anxiety.  ENDOCRINOLOGIC: No reports of sweating, cold or heat intolerance. No polyuria or polydipsia.  Marland Kitchen   Physical Examination p 52 bp 124/80 Wt 190 lbs BMi 27 Gen: resting comfortably, no acute distress HEENT: no scleral icterus, pupils equal round and reactive, no palptable  cervical adenopathy,  CV: rate 55, regular, no m/r/g, no JVD Resp: Clear to auscultation bilaterally GI: abdomen is soft, non-tender, non-distended, normal bowel sounds, no hepatosplenomegaly MSK: extremities are warm, no edema.  Skin: warm, no rash Neuro:  no focal deficits Psych: appropriate affect   Diagnostic Studies  09/2006 Echo LEFT VENTRICLE: - Left ventricular size was normal. - Overall left ventricular systolic function was normal. - Left ventricular ejection fraction was estimated , range being 55 % to 60 %. - There were no left ventricular regional wall motion abnormalities. - Left ventricular wall thickness was  normal.  AORTIC VALVE: - The aortic valve was trileaflet. - Aortic valve thickness was mildly increased. - The aortic valve was not calcified.  Doppler interpretation(s): - Transaortic velocity was within the normal range. - There was no significant aortic valvular regurgitation.  AORTA: - The aortic root was normal in size.  MITRAL VALVE: - Mitral valve structure was normal.  Doppler interpretation(s): - There was mild mitral valvular regurgitation.  LEFT ATRIUM: - Left atrial size was normal.  RIGHT VENTRICLE: - Right ventricular size was normal. - Right ventricular systolic function was normal. - Right ventricular wall thickness was normal.  PULMONIC VALVE: - The pulmonic valve was grossly normal.  TRICUSPID VALVE: - The tricuspid valve structure was normal.  Doppler interpretation(s): - There was no significant tricuspid valvular regurgitation.  RIGHT ATRIUM: - Right atrial size was normal.  PERICARDIUM: - There was no pericardial effusion.  ---------------------------------------------------------------  SUMMARY - Overall left ventricular systolic function was normal. Left ventricular ejection fraction was estimated , range being 55 % to 60 %. There were no left ventricular regional wall motion abnormalities. - Aortic valve thickness was mildly increased. - There was mild mitral valvular regurgitation.     Assessment and Plan  1. Bradycardia - sinus brady, no significant blocks on EKG - no significant symptoms - likely related to his daily atenolol, will decrease to  daily and follow rates. If needed can stop atenolol and use alternative bp agent, he has room to go up on his ACE if needed. Defer further workup for bradycardia pending response to weaning atenolol - he will keep bp log and bring in 2 weeks  F/u 1 month      Antoine Poche, M.D., F.A.C.C.

## 2014-01-08 ENCOUNTER — Telehealth: Payer: Self-pay | Admitting: *Deleted

## 2014-01-08 NOTE — Telephone Encounter (Signed)
Message copied by Lesle Chris on Mon Jan 08, 2014 11:44 AM ------      Message from: Novelty F      Created: Mon Jan 08, 2014  8:56 AM       BP log reviewed, blood pressures and heart rates look good. No med changes.            Dominga Ferry MD ------

## 2014-01-11 NOTE — Telephone Encounter (Signed)
Patient notified.  Already has follow up for 01/30/2014 with Dr. Wyline Mood.

## 2014-01-30 ENCOUNTER — Telehealth: Payer: Self-pay | Admitting: Nurse Practitioner

## 2014-01-30 ENCOUNTER — Encounter: Payer: Self-pay | Admitting: Cardiology

## 2014-01-30 ENCOUNTER — Ambulatory Visit (INDEPENDENT_AMBULATORY_CARE_PROVIDER_SITE_OTHER): Payer: Commercial Managed Care - HMO | Admitting: Cardiology

## 2014-01-30 ENCOUNTER — Other Ambulatory Visit: Payer: Self-pay | Admitting: *Deleted

## 2014-01-30 VITALS — BP 134/81 | HR 55 | Ht 71.0 in | Wt 195.0 lb

## 2014-01-30 DIAGNOSIS — R001 Bradycardia, unspecified: Secondary | ICD-10-CM

## 2014-01-30 DIAGNOSIS — I10 Essential (primary) hypertension: Secondary | ICD-10-CM

## 2014-01-30 MED ORDER — ALPRAZOLAM 0.5 MG PO TABS: 0.5000 mg | ORAL_TABLET | Freq: Two times a day (BID) | ORAL | Status: DC | PRN

## 2014-01-30 MED ORDER — LISINOPRIL 10 MG PO TABS: 10.0000 mg | ORAL_TABLET | Freq: Every day | ORAL | Status: DC

## 2014-01-30 NOTE — Telephone Encounter (Signed)
Called in.

## 2014-01-30 NOTE — Progress Notes (Signed)
Clinical Summary [name] is a 68 y.o.male seen today for follow up of the following medical problems.  1. Bradycardia  - last visit patient noted with sinus brady in mid 40s, no evidence of block. Fairly limited symptoms. He was on atenolol 100mg  daily at the time. Decreased dose to 50mg  daily. HRs at home mid 50s to low 60s, improved but still some symptoms of dizziness since last visit  2. HTN - submitted bp log in mid September, heart rates and bp's look good.  Past Medical History  Diagnosis Date  . Anxiety   . Depression   . Hypertension   . Hyperlipidemia   . GERD (gastroesophageal reflux disease)      Allergies  Allergen Reactions  . Diovan [Valsartan] Nausea And Vomiting  . Hydrocodone Nausea And Vomiting    Chest discomfort.  . Penicillins     Chest pain     Current Outpatient Prescriptions  Medication Sig Dispense Refill  . ALPRAZolam (XANAX) 0.5 MG tablet Take 0.5 mg by mouth. Take one tablet every  8 hours for anxiety      . AMLODIPINE BESYLATE PO Take 10 mg by mouth daily.      Marland Kitchen. atenolol (TENORMIN) 100 MG tablet Take 0.5 tablets (50 mg total) by mouth daily.      Marland Kitchen. lisinopril (PRINIVIL,ZESTRIL) 5 MG tablet Take 5 mg by mouth. Take 1/2 daily to protect kidneys      . omeprazole (PRILOSEC) 20 MG capsule Take 20 mg by mouth 2 (two) times daily before a meal.      . pravastatin (PRAVACHOL) 40 MG tablet Take 40 mg by mouth. TAKE 1/2 TABLET AT HS      . SERTRALINE HCL PO Take 100 mg by mouth. Take one tablet twice daily for anxiety and depression       No current facility-administered medications for this visit.     No past surgical history on file.   Allergies  Allergen Reactions  . Diovan [Valsartan] Nausea And Vomiting  . Hydrocodone Nausea And Vomiting    Chest discomfort.  . Penicillins     Chest pain      Family History  Problem Relation Age of Onset  . Heart disease Mother   . Heart disease Father      Social History [name]  reports that he has never smoked. He does not have any smokeless tobacco history on file. [name] reports that he does not drink alcohol.   Review of Systems CONSTITUTIONAL: No weight loss, fever, chills, weakness or fatigue.  HEENT: Eyes: No visual loss, blurred vision, double vision or yellow sclerae.No hearing loss, sneezing, congestion, runny nose or sore throat.  SKIN: No rash or itching.  CARDIOVASCULAR: per HPI RESPIRATORY: No shortness of breath, cough or sputum.  GASTROINTESTINAL: No anorexia, nausea, vomiting or diarrhea. No abdominal pain or blood.  GENITOURINARY: No burning on urination, no polyuria NEUROLOGICAL: No headache, dizziness, syncope, paralysis, ataxia, numbness or tingling in the extremities. No change in bowel or bladder control.  MUSCULOSKELETAL: No muscle, back pain, joint pain or stiffness.  LYMPHATICS: No enlarged nodes. No history of splenectomy.  PSYCHIATRIC: No history of depression or anxiety.  ENDOCRINOLOGIC: No reports of sweating, cold or heat intolerance. No polyuria or polydipsia.  Marland Kitchen.   Physical Examination p 55 bp 134/81 Wt 195 lbs BMI 27 Gen: resting comfortably, no acute distress HEENT: no scleral icterus, pupils equal round and reactive, no palptable cervical adenopathy,  CV:  rate 55, regular, no m/r/g, no JVD, no carotid bruits Resp: Clear to auscultation bilaterally GI: abdomen is soft, non-tender, non-distended, normal bowel sounds, no hepatosplenomegaly MSK: extremities are warm, no edema.  Skin: warm, no rash Neuro:  no focal deficits Psych: appropriate affect   Diagnostic Studies 09/2006 Echo  LEFT VENTRICLE: - Left ventricular size was normal. - Overall left ventricular systolic function was normal. - Left ventricular ejection fraction was estimated , range being 55 % to 60 %. - There were no left ventricular regional wall motion abnormalities. - Left ventricular wall thickness was normal.  AORTIC VALVE: - The aortic valve  was trileaflet. - Aortic valve thickness was mildly increased. - The aortic valve was not calcified.  Doppler interpretation(s): - Transaortic velocity was within the normal range. - There was no significant aortic valvular regurgitation.  AORTA: - The aortic root was normal in size.  MITRAL VALVE: - Mitral valve structure was normal.  Doppler interpretation(s): - There was mild mitral valvular regurgitation.  LEFT ATRIUM: - Left atrial size was normal.  RIGHT VENTRICLE: - Right ventricular size was normal. - Right ventricular systolic function was normal. - Right ventricular wall thickness was normal.  PULMONIC VALVE: - The pulmonic valve was grossly normal.  TRICUSPID VALVE: - The tricuspid valve structure was normal.  Doppler interpretation(s): - There was no significant tricuspid valvular regurgitation.  RIGHT ATRIUM: - Right atrial size was normal.  PERICARDIUM: - There was no pericardial effusion.  ---------------------------------------------------------------  SUMMARY - Overall left ventricular systolic function was normal. Left ventricular ejection fraction was estimated , range being 55 % to 60 %. There were no left ventricular regional wall motion abnormalities. - Aortic valve thickness was mildly increased. - There was mild mitral valvular regurgitation.       Assessment and Plan  1. Bradycardia  - sinus brady, no significant blocks on EKG. Improved but not resolved on lower dose of beta blocker, still with some mild symptoms. Will stop atenolol and continue to follow clinically. Most likely all related to beta blocker, going for labs next week, will add on TSH for completeness.   2. HTN - stop atenolol, increase lisionpril to 10mg  daily. Get BMET in 2 weeks, he is to submit bp log in 2 weeks as well.         Antoine Poche, M.D.

## 2014-01-30 NOTE — Patient Instructions (Addendum)
   Stop Atenolol  Increase Lisinopril to 10mg  daily. ** You make take 2 of your 5mg  tablets until you get your new bottle. When you get your new bottle you will only take one**  Labs for BMET and TSH to be done in 2 weeks.  Record Blood Pressure for 2 weeks on log given. Your physician wants you to follow up in: 6 months.  You will receive a reminder letter in the mail one-two months in advance.  If you don't receive a letter, please call our office to schedule the follow up appointment

## 2014-01-30 NOTE — Telephone Encounter (Signed)
Please call in xanax 0.5 po BID #[phone]  with 0 refills

## 2014-02-01 ENCOUNTER — Telehealth: Payer: Self-pay | Admitting: Nurse Practitioner

## 2014-02-01 NOTE — Telephone Encounter (Signed)
Ptient aware, script called in.

## 2014-02-13 ENCOUNTER — Other Ambulatory Visit (INDEPENDENT_AMBULATORY_CARE_PROVIDER_SITE_OTHER): Payer: Commercial Managed Care - HMO

## 2014-02-13 DIAGNOSIS — R001 Bradycardia, unspecified: Secondary | ICD-10-CM

## 2014-02-13 LAB — TSH: TSH: 2.088 u[IU]/mL (ref 0.350–4.500)

## 2014-02-13 LAB — BASIC METABOLIC PANEL
BUN: 14 mg/dL (ref 6–23)
CO2: 27 mEq/L (ref 19–32)
Calcium: 9.2 mg/dL (ref 8.4–10.5)
Chloride: 99 mEq/L (ref 96–112)
Creat: 0.84 mg/dL (ref 0.50–1.35)
GLUCOSE: 165 mg/dL — AB (ref 70–99)
POTASSIUM: 3.6 meq/L (ref 3.5–5.3)
SODIUM: 137 meq/L (ref 135–145)

## 2014-02-15 ENCOUNTER — Telehealth: Payer: Self-pay | Admitting: *Deleted

## 2014-02-15 NOTE — Telephone Encounter (Signed)
Message copied by Jerrye BeaversJONES,  on Thu Feb 15, 2014  2:00 PM ------      Message from: CowdenBRANCH, JONATHAN F      Created: Thu Feb 15, 2014  1:18 PM       Labs look good                  Dominga FerryJ Branch MD ------

## 2014-02-15 NOTE — Telephone Encounter (Signed)
Pt informed

## 2014-02-15 NOTE — Telephone Encounter (Signed)
Message copied by Jerrye BeaversJONES,  on Thu Feb 15, 2014 10:39 AM ------      Message from: [place]boro[place]ANCH, JONATHAN F      Created: Wed Feb 14, 2014  4:18 PM       Bp log reviewed, overall bp looks good. Heart rate much improved off atenolol. Continue current meds            Dominga FerryJ Branch MD ------

## 2014-02-23 ENCOUNTER — Telehealth: Payer: Self-pay | Admitting: Nurse Practitioner

## 2014-02-23 NOTE — Telephone Encounter (Signed)
Xanax not due for refill until 03/02/14

## 2014-02-23 NOTE — Telephone Encounter (Signed)
Needs refill

## 2014-02-24 NOTE — Telephone Encounter (Signed)
Patient only got 30 last month and he has ran out. He states that his prescription stated he can take 2 a day please advise

## 2014-02-25 NOTE — Telephone Encounter (Signed)
Please call in xanax with 1 refills 

## 2014-02-26 ENCOUNTER — Other Ambulatory Visit: Payer: Self-pay | Admitting: *Deleted

## 2014-02-26 NOTE — Telephone Encounter (Signed)
Xanax ,qty 30 with 1 refill per provider called in.

## 2014-03-12 ENCOUNTER — Encounter: Payer: Self-pay | Admitting: Nurse Practitioner

## 2014-03-12 ENCOUNTER — Ambulatory Visit (INDEPENDENT_AMBULATORY_CARE_PROVIDER_SITE_OTHER): Payer: Commercial Managed Care - HMO | Admitting: Nurse Practitioner

## 2014-03-12 ENCOUNTER — Ambulatory Visit (INDEPENDENT_AMBULATORY_CARE_PROVIDER_SITE_OTHER): Payer: Commercial Managed Care - HMO | Admitting: *Deleted

## 2014-03-12 VITALS — BP 132/91 | HR 83 | Temp 98.1°F | Ht 71.0 in | Wt 190.0 lb

## 2014-03-12 DIAGNOSIS — F32A Depression, unspecified: Secondary | ICD-10-CM

## 2014-03-12 DIAGNOSIS — I25111 Atherosclerotic heart disease of native coronary artery with angina pectoris with documented spasm: Secondary | ICD-10-CM

## 2014-03-12 DIAGNOSIS — I10 Essential (primary) hypertension: Secondary | ICD-10-CM

## 2014-03-12 DIAGNOSIS — E785 Hyperlipidemia, unspecified: Secondary | ICD-10-CM

## 2014-03-12 DIAGNOSIS — Z23 Encounter for immunization: Secondary | ICD-10-CM

## 2014-03-12 DIAGNOSIS — K219 Gastro-esophageal reflux disease without esophagitis: Secondary | ICD-10-CM

## 2014-03-12 DIAGNOSIS — F411 Generalized anxiety disorder: Secondary | ICD-10-CM

## 2014-03-12 DIAGNOSIS — F329 Major depressive disorder, single episode, unspecified: Secondary | ICD-10-CM

## 2014-03-12 NOTE — Addendum Note (Signed)
Addended by: Orma RenderHODGES,  F on: 03/12/2014 05:46 PM   Modules accepted: Orders

## 2014-03-12 NOTE — Progress Notes (Signed)
Subjective:    Patient ID: Albert [name], male    DOB: 02/03/1946, 68 y.o.   MRN: [id number]  Patient is here today for chronic visit follow up. No acute complaint. [facility] but s no longer going there.  Hypertension This is a chronic problem. The current episode started more than 1 year ago. The problem is unchanged. The problem is controlled. Associated symptoms include anxiety. Pertinent negatives include no chest pain, headaches, peripheral edema or shortness of breath. Risk factors for coronary artery disease include dyslipidemia and male gender. Past treatments include calcium channel blockers, beta blockers and ACE inhibitors. The current treatment provides significant improvement. Compliance problems include diet and exercise.  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  Hyperlipidemia This is a chronic problem. The current episode started more than 1 year ago. The problem is uncontrolled. He has no history of diabetes or hypothyroidism. Pertinent negatives include no chest pain or shortness of breath. Current antihyperlipidemic treatment includes statins. The current treatment provides moderate improvement of lipids. Risk factors for coronary artery disease include dyslipidemia and male sex.  GERD Patient is taking omeprazole daily- works well- no heartburn Depression/GAD Patient currently on sertraline and xanax- has been anxious and depressed since wife passed away- wants to cut back on xanax. CAD Saw Dr. Wyline MoodBranch in 10/16, he discontinued atenolol and increase lisinopril to 10mg  daily. .     Review of Systems  Constitutional: Negative.   HENT: Negative.   Respiratory: Negative for shortness of breath.   Cardiovascular: Negative for chest pain.  Genitourinary: Negative.   Neurological: Negative for headaches.  Psychiatric/Behavioral: Negative.   All other systems reviewed and are negative.      Objective:   Physical Exam  Constitutional:  He is oriented to person, place, and time. He appears well-developed and well-nourished.  HENT:  Head: Normocephalic.  Right Ear: External ear normal.  Left Ear: External ear normal.  Nose: Nose normal.  Mouth/Throat: Oropharynx is clear and moist.  Eyes: EOM are normal. Pupils are equal, round, and reactive to light.  Neck: Normal range of motion. Neck supple. No JVD present. No thyromegaly present.  Cardiovascular: Normal rate, regular rhythm, normal heart sounds and intact distal pulses.  Exam reveals no gallop and no friction rub.   No murmur heard. Pulmonary/Chest: Effort normal and breath sounds normal. No respiratory distress. He has no wheezes. He has no rales. He exhibits no tenderness.  Abdominal: Soft. Bowel sounds are normal. He exhibits no mass. There is no tenderness.  Musculoskeletal: Normal range of motion. He exhibits no edema.  Lymphadenopathy:    He has no cervical adenopathy.  Neurological: He is alert and oriented to person, place, and time. No cranial nerve deficit.  Skin: Skin is warm and dry.  Psychiatric: He has a normal mood and affect. His behavior is normal. Judgment and thought content normal.   BP 132/91 mmHg  Pulse 83  Temp(Src) 98.1 F (36.7 C) (Oral)  Ht 5\' 11"  (1.803 m)  Wt 190 lb (86.183 kg)  BMI 26.51 kg/m2       Assessment & Plan:  1. Gastroesophageal reflux disease without esophagitis Avoid spicy and fatty foods  2. Essential hypertension, benign Low NA diet  3. Hyperlipidemia with target LDL less than 100 Low fat diet - NMR, lipoprofile  4. Depression Stress management  5. GAD (generalized anxiety disorder)  6. Coronary artery disease involving native coronary artery of native heart with angina  pectoris with documented spasm Keep follow up appointments with cardiologist    Labs pending Health maintenance reviewed Diet and exercise encouraged Continue all meds Follow up  In 3 months   Albert Daphine DeutscherMartin,  FNP

## 2014-03-12 NOTE — Patient Instructions (Signed)

## 2014-03-12 NOTE — Telephone Encounter (Signed)
Left message for mr Albert [name] to call to have him come in for a NMR that was ordered on 03-12-14 at his apt

## 2014-03-13 ENCOUNTER — Telehealth: Payer: Self-pay | Admitting: Nurse Practitioner

## 2014-03-19 NOTE — Telephone Encounter (Signed)
No documentation of us calling.

## 2014-04-03 ENCOUNTER — Other Ambulatory Visit: Payer: Self-pay | Admitting: Nurse Practitioner

## 2014-04-04 MED ORDER — OMEPRAZOLE 20 MG PO CPDR: 20.0000 mg | DELAYED_RELEASE_CAPSULE | Freq: Two times a day (BID) | ORAL | Status: DC

## 2014-04-04 MED ORDER — PRAVASTATIN SODIUM 40 MG PO TABS: 40.0000 mg | ORAL_TABLET | Freq: Every day | ORAL | Status: DC

## 2014-04-04 MED ORDER — SERTRALINE HCL 100 MG PO TABS: ORAL_TABLET | ORAL | Status: DC

## 2014-04-04 NOTE — Telephone Encounter (Signed)
All of his directions are not clear?

## 2014-04-10 ENCOUNTER — Telehealth: Payer: Self-pay | Admitting: Nurse Practitioner

## 2014-04-10 NOTE — Telephone Encounter (Signed)
Pt to speak to [facility]

## 2014-04-16 ENCOUNTER — Other Ambulatory Visit: Payer: Self-pay | Admitting: *Deleted

## 2014-04-16 MED ORDER — PRAVASTATIN SODIUM 40 MG PO TABS: 40.0000 mg | ORAL_TABLET | Freq: Every day | ORAL | Status: DC

## 2014-04-16 MED ORDER — LISINOPRIL 10 MG PO TABS: 10.0000 mg | ORAL_TABLET | Freq: Every day | ORAL | Status: DC

## 2014-04-16 MED ORDER — SERTRALINE HCL 100 MG PO TABS: ORAL_TABLET | ORAL | Status: DC

## 2014-04-16 MED ORDER — OMEPRAZOLE 20 MG PO CPDR: 20.0000 mg | DELAYED_RELEASE_CAPSULE | Freq: Two times a day (BID) | ORAL | Status: DC

## 2014-04-16 MED ORDER — AMLODIPINE BESYLATE 10 MG PO TABS: 10.0000 mg | ORAL_TABLET | Freq: Every day | ORAL | Status: DC

## 2014-04-16 NOTE — Telephone Encounter (Signed)
Pt requesting 90 day supply of meds be sent to [facility].

## 2014-04-16 NOTE — Telephone Encounter (Signed)
Pt aware rx's sent to [facility] pharmacy as requested, will close encounter.

## 2014-04-30 ENCOUNTER — Other Ambulatory Visit: Payer: Self-pay | Admitting: Nurse Practitioner

## 2014-05-01 MED ORDER — ALPRAZOLAM 0.5 MG PO TABS: 0.5000 mg | ORAL_TABLET | Freq: Two times a day (BID) | ORAL | Status: DC | PRN

## 2014-05-01 NOTE — Telephone Encounter (Signed)
Called to mail order.

## 2014-05-01 NOTE — Telephone Encounter (Signed)
Xanax rx ready for pick up to mail to [facility]

## 2014-05-14 ENCOUNTER — Telehealth: Payer: Self-pay | Admitting: Nurse Practitioner

## 2014-05-17 ENCOUNTER — Other Ambulatory Visit: Payer: Self-pay | Admitting: *Deleted

## 2014-05-17 NOTE — Telephone Encounter (Signed)
The January 5th script ,   Xanax with qty 90,  per MMM was called in today to [facility].  No printed script found and [facility] has no record of script.

## 2014-06-15 ENCOUNTER — Ambulatory Visit: Payer: Commercial Managed Care - HMO | Admitting: Nurse Practitioner

## 2014-07-02 ENCOUNTER — Other Ambulatory Visit: Payer: Self-pay | Admitting: Nurse Practitioner

## 2014-07-02 MED ORDER — ALPRAZOLAM 0.5 MG PO TABS: 0.5000 mg | ORAL_TABLET | Freq: Two times a day (BID) | ORAL | Status: DC | PRN

## 2014-07-02 NOTE — Telephone Encounter (Signed)
rx called into pharmacy

## 2014-07-02 NOTE — Telephone Encounter (Signed)
Please call in xanax 0.5 1 po TID #90 with 0 refills  

## 2014-07-05 ENCOUNTER — Encounter: Payer: Self-pay | Admitting: Nurse Practitioner

## 2014-07-05 ENCOUNTER — Ambulatory Visit (INDEPENDENT_AMBULATORY_CARE_PROVIDER_SITE_OTHER): Payer: Commercial Managed Care - HMO | Admitting: Nurse Practitioner

## 2014-07-05 VITALS — BP 136/87 | HR 70 | Temp 97.1°F | Ht 71.0 in | Wt 193.0 lb

## 2014-07-05 DIAGNOSIS — F411 Generalized anxiety disorder: Secondary | ICD-10-CM | POA: Diagnosis not present

## 2014-07-05 DIAGNOSIS — E785 Hyperlipidemia, unspecified: Secondary | ICD-10-CM | POA: Diagnosis not present

## 2014-07-05 DIAGNOSIS — F32A Depression, unspecified: Secondary | ICD-10-CM

## 2014-07-05 DIAGNOSIS — F329 Major depressive disorder, single episode, unspecified: Secondary | ICD-10-CM | POA: Diagnosis not present

## 2014-07-05 DIAGNOSIS — K219 Gastro-esophageal reflux disease without esophagitis: Secondary | ICD-10-CM | POA: Diagnosis not present

## 2014-07-05 DIAGNOSIS — I10 Essential (primary) hypertension: Secondary | ICD-10-CM | POA: Diagnosis not present

## 2014-07-05 DIAGNOSIS — Z23 Encounter for immunization: Secondary | ICD-10-CM | POA: Diagnosis not present

## 2014-07-05 DIAGNOSIS — I25111 Atherosclerotic heart disease of native coronary artery with angina pectoris with documented spasm: Secondary | ICD-10-CM | POA: Diagnosis not present

## 2014-07-05 NOTE — Progress Notes (Signed)
Subjective:    Patient ID: Albert [name], male    DOB: 1945-10-11, 69 y.o.   MRN: [id number]  Patient is here today for chronic visit follow up. No acute complaint. He use to go to the [place] for care but is no longer going there.  Hypertension This is a chronic problem. The current episode started more than 1 year ago. The problem is uncontrolled. Pertinent negatives include no chest pain, headaches or shortness of breath. Risk factors for coronary artery disease include dyslipidemia, male gender, post-menopausal state and sedentary lifestyle. Past treatments include ACE inhibitors and calcium channel blockers. The current treatment provides moderate improvement. There are no compliance problems.   Hyperlipidemia This is a chronic problem. The current episode started more than 1 year ago. The problem is controlled. Recent lipid tests were reviewed and are variable. He has no history of diabetes, hypothyroidism or obesity. Pertinent negatives include no chest pain or shortness of breath. Current antihyperlipidemic treatment includes statins. The current treatment provides moderate improvement of lipids. Compliance problems include adherence to diet and adherence to exercise.  Risk factors for coronary artery disease include dyslipidemia, hypertension and male sex.  GERD Patient is taking omeprazole daily- works well- no heartburn Depression/GAD Patient currently on sertraline and xanax- has been anxious and depressed since wife passed away- wants to cut back on xanax. CAD Sees dr. Harl Bowie every 6 months     Review of Systems  Constitutional: Negative.   HENT: Negative.   Eyes: Negative.   Respiratory: Negative for shortness of breath.   Cardiovascular: Negative for chest pain.  Genitourinary: Negative.   Neurological: Negative for headaches.  Psychiatric/Behavioral: Negative.   All other systems reviewed and are negative.      Objective:   Physical Exam  Constitutional: He is oriented  to person, place, and time. He appears well-developed and well-nourished.  HENT:  Head: Normocephalic.  Right Ear: External ear normal.  Left Ear: External ear normal.  Nose: Nose normal.  Mouth/Throat: Oropharynx is clear and moist.  Eyes: EOM are normal. Pupils are equal, round, and reactive to light.  Neck: Normal range of motion. Neck supple. No JVD present. No thyromegaly present.  Cardiovascular: Normal rate, regular rhythm, normal heart sounds and intact distal pulses.  Exam reveals no gallop and no friction rub.   No murmur heard. Pulmonary/Chest: Effort normal and breath sounds normal. No respiratory distress. He has no wheezes. He has no rales. He exhibits no tenderness.  Abdominal: Soft. Bowel sounds are normal. He exhibits no mass. There is no tenderness.  Musculoskeletal: Normal range of motion. He exhibits no edema.  Lymphadenopathy:    He has no cervical adenopathy.  Neurological: He is alert and oriented to person, place, and time. No cranial nerve deficit.  Skin: Skin is warm and dry.  Psychiatric: He has a normal mood and affect. His behavior is normal. Judgment and thought content normal.   BP 136/87 mmHg  Pulse 70  Temp(Src) 97.1 F (36.2 C) (Oral)  Ht 5' 11"  (1.803 m)  Wt 193 lb (87.544 kg)  BMI 26.93 kg/m2       Assessment & Plan:    1. Essential hypertension, benign Do not add salt to diet - CMP14+EGFR  2. Coronary artery disease involving native coronary artery of native heart with angina pectoris with documented spasm  3. Gastroesophageal reflux disease without esophagitis Avoid spicy foods Do not eat 2 hours prior to bedtime  4. Hyperlipidemia with target LDL less than 100  Low fat diet - NMR, lipoprofile  5. GAD (generalized anxiety disorder) Stress management  6. Depression   prevnar 13 today Labs pending Health maintenance reviewed Diet and exercise encouraged Continue all meds Follow up  In 3 months   [place],  FNP

## 2014-07-05 NOTE — Patient Instructions (Signed)

## 2014-07-05 NOTE — Addendum Note (Signed)
Addended by: Cleda DaubUCKER, Melvyn Hommes G on: 07/05/2014 11:42 AM   Modules accepted: Orders

## 2014-07-06 LAB — CMP14+EGFR
A/G RATIO: 1.6 (ref 1.1–2.5)
ALT: 32 IU/L (ref 0–44)
AST: 22 IU/L (ref 0–40)
Albumin: 4.6 g/dL (ref 3.6–4.8)
Alkaline Phosphatase: 83 IU/L (ref 39–117)
BUN/Creatinine Ratio: 21 (ref 10–22)
BUN: 18 mg/dL (ref 8–27)
Bilirubin Total: 0.4 mg/dL (ref 0.0–1.2)
CALCIUM: 9.5 mg/dL (ref 8.6–10.2)
CHLORIDE: 96 mmol/L — AB (ref 97–108)
CO2: 25 mmol/L (ref 18–29)
Creatinine, Ser: 0.85 mg/dL (ref 0.76–1.27)
GFR calc Af Amer: 105 mL/min/{1.73_m2} (ref >59)
GFR, EST NON AFRICAN AMERICAN: 91 mL/min/{1.73_m2} (ref >59)
GLUCOSE: 109 mg/dL — AB (ref 65–99)
Globulin, Total: 2.9 g/dL (ref 1.5–4.5)
POTASSIUM: 4.2 mmol/L (ref 3.5–5.2)
Sodium: 137 mmol/L (ref 134–144)
TOTAL PROTEIN: 7.5 g/dL (ref 6.0–8.5)

## 2014-07-06 LAB — NMR, LIPOPROFILE
CHOLESTEROL: 167 mg/dL (ref 100–199)
HDL Cholesterol by NMR: 65 mg/dL (ref >39)
HDL Particle Number: 39.5 umol/L (ref >=30.5)
LDL Particle Number: 899 nmol/L (ref <1000)
LDL SIZE: 21.2 nm (ref >20.5)
LDL-C: 83 mg/dL (ref 0–99)
LP-IR SCORE: 32 (ref <=45)
SMALL LDL PARTICLE NUMBER: 344 nmol/L (ref <=527)
Triglycerides by NMR: 94 mg/dL (ref 0–149)

## 2014-07-31 ENCOUNTER — Other Ambulatory Visit: Payer: Self-pay | Admitting: Nurse Practitioner

## 2014-08-01 NOTE — Telephone Encounter (Signed)
Please call in xanax with 1 refills 

## 2014-08-01 NOTE — Telephone Encounter (Signed)
rx called into pharmacy

## 2014-08-01 NOTE — Telephone Encounter (Signed)
Please advise on refill.  Last seen 07/05/14, has follow up with MMM 10/25/14.

## 2014-08-06 ENCOUNTER — Encounter: Payer: Commercial Managed Care - HMO | Admitting: Cardiology

## 2014-08-06 NOTE — Progress Notes (Signed)
ERROR

## 2014-09-20 ENCOUNTER — Ambulatory Visit (INDEPENDENT_AMBULATORY_CARE_PROVIDER_SITE_OTHER): Payer: Commercial Managed Care - HMO | Admitting: Cardiology

## 2014-09-20 ENCOUNTER — Encounter: Payer: Self-pay | Admitting: Cardiology

## 2014-09-20 VITALS — BP 126/87 | HR 79 | Ht 71.0 in | Wt 192.8 lb

## 2014-09-20 DIAGNOSIS — I10 Essential (primary) hypertension: Secondary | ICD-10-CM | POA: Diagnosis not present

## 2014-09-20 DIAGNOSIS — R001 Bradycardia, unspecified: Secondary | ICD-10-CM | POA: Diagnosis not present

## 2014-09-20 DIAGNOSIS — E785 Hyperlipidemia, unspecified: Secondary | ICD-10-CM

## 2014-09-20 NOTE — Progress Notes (Signed)
Clinical Summary Albert [name] is a 69 y.o.male seen today for follow up of the following medical problems.   1. Bradycardia  - resolved after stopping atenolol - checks heart rates daily at home, typically 70-80s - denies any lightheadedness or dizziness.   2. HTN - last visit stopped atenolol and started lisinopril 10 mg daily - checks at home, typically 110s/80s.   3. Hyperlipidemia - 06/2014 LDL 83 HDL 65 TG 94 TC 167 - compliant with statin   Past Medical History  Diagnosis Date  . Anxiety   . Depression   . Hypertension   . Hyperlipidemia   . GERD (gastroesophageal reflux disease)      Allergies  Allergen Reactions  . Diovan [Valsartan] Nausea And Vomiting  . Hydrocodone Nausea And Vomiting    Chest discomfort.  . Penicillins     Chest pain     Current Outpatient Prescriptions  Medication Sig Dispense Refill  . ALPRAZolam (XANAX) 0.5 MG tablet TAKE 1 TABLET EVERY 8 HOURS AS NEEDED FOR ANXIETY. 90 tablet 1  . amLODipine (NORVASC) 10 MG tablet Take 1 tablet (10 mg total) by mouth daily. 90 tablet 1  . aspirin 81 MG tablet Take 81 mg by mouth 2 (two) times daily.     Marland Kitchen. lisinopril (PRINIVIL,ZESTRIL) 10 MG tablet Take 1 tablet (10 mg total) by mouth daily. 90 tablet 1  . omeprazole (PRILOSEC) 20 MG capsule Take 1 capsule (20 mg total) by mouth 2 (two) times daily before a meal. 180 capsule 1  . pravastatin (PRAVACHOL) 40 MG tablet Take 1 tablet (40 mg total) by mouth daily. 90 tablet 1  . sertraline (ZOLOFT) 100 MG tablet Take one tablet twice daily for anxiety and depression 180 tablet 1   No current facility-administered medications for this visit.     No past surgical history on file.   Allergies  Allergen Reactions  . Diovan [Valsartan] Nausea And Vomiting  . Hydrocodone Nausea And Vomiting    Chest discomfort.  . Penicillins     Chest pain      Family History  Problem Relation Age of Onset  . Heart disease Mother   . Heart disease Father        Social History Albert [name] reports that he has never smoked. He has never used smokeless tobacco. Albert [name] reports that he does not drink alcohol.   Review of Systems CONSTITUTIONAL: No weight loss, fever, chills, weakness or fatigue.  HEENT: Eyes: No visual loss, blurred vision, double vision or yellow sclerae.No hearing loss, sneezing, congestion, runny nose or sore throat.  SKIN: No rash or itching.  CARDIOVASCULAR: no chest pain, no palpitations, no orthopnea RESPIRATORY: No shortness of breath, cough or sputum.  GASTROINTESTINAL: No anorexia, nausea, vomiting or diarrhea. No abdominal pain or blood.  GENITOURINARY: No burning on urination, no polyuria NEUROLOGICAL: No headache, dizziness, syncope, paralysis, ataxia, numbness or tingling in the extremities. No change in bowel or bladder control.  MUSCULOSKELETAL: No muscle, back pain, joint pain or stiffness.  LYMPHATICS: No enlarged nodes. No history of splenectomy.  PSYCHIATRIC: No history of depression or anxiety.  ENDOCRINOLOGIC: No reports of sweating, cold or heat intolerance. No polyuria or polydipsia.  Marland Kitchen.   Physical Examination Filed Vitals:   09/20/14 0831  BP: 126/87  Pulse: 79   Filed Vitals:   09/20/14 0831  Height: 5\' 11"  (1.803 m)  Weight: 192 lb 12.8 oz (87.454 kg)    Gen: resting comfortably, no acute distress  HEENT: no scleral icterus, pupils equal round and reactive, no palptable cervical adenopathy,  CV: RRR, no m/r/g, no JVD Resp: Clear to auscultation bilaterally GI: abdomen is soft, non-tender, non-distended, normal bowel sounds, no hepatosplenomegaly MSK: extremities are warm, no edema.  Skin: warm, no rash Neuro:  no focal deficits Psych: appropriate affect   Diagnostic Studies  09/2006 Echo  LEFT VENTRICLE: - Left ventricular size was normal. - Overall left ventricular systolic function was normal. - Left ventricular ejection fraction was estimated , range being 55 % to 60 %. -  There were no left ventricular regional wall motion abnormalities. - Left ventricular wall thickness was normal.  AORTIC VALVE: - The aortic valve was trileaflet. - Aortic valve thickness was mildly increased. - The aortic valve was not calcified.  Doppler interpretation(s): - Transaortic velocity was within the normal range. - There was no significant aortic valvular regurgitation.  AORTA: - The aortic root was normal in size.  MITRAL VALVE: - Mitral valve structure was normal.  Doppler interpretation(s): - There was mild mitral valvular regurgitation.  LEFT ATRIUM: - Left atrial size was normal.  RIGHT VENTRICLE: - Right ventricular size was normal. - Right ventricular systolic function was normal. - Right ventricular wall thickness was normal.  PULMONIC VALVE: - The pulmonic valve was grossly normal.  TRICUSPID VALVE: - The tricuspid valve structure was normal.  Doppler interpretation(s): - There was no significant tricuspid valvular regurgitation.  RIGHT ATRIUM: - Right atrial size was normal.  PERICARDIUM: - There was no pericardial effusion.  ---------------------------------------------------------------  SUMMARY - Overall left ventricular systolic function was normal. Left ventricular ejection fraction was estimated , range being 55 % to 60 %. There were no left ventricular regional wall motion abnormalities. - Aortic valve thickness was mildly increased. - There was mild mitral valvular regurgitation.   Assessment and Plan   1. Bradycardia  - resolved since stopping atenolol - continue to follow clinically   2. HTN - at goal, continue current meds  3. Hyperlipidemia - at goal, continue current statin   F/u 1 year       Antoine Poche, M.D.

## 2014-09-20 NOTE — Patient Instructions (Signed)
Your physician wants you to follow-up in: 1 year with Dr. Branch  You will receive a reminder letter in the mail two months in advance. If you don't receive a letter, please call our office to schedule the follow-up appointment.  Your physician recommends that you continue on your current medications as directed. Please refer to the Current Medication list given to you today.  Thank you for choosing [facility]!!   

## 2014-10-01 ENCOUNTER — Other Ambulatory Visit: Payer: Self-pay | Admitting: Nurse Practitioner

## 2014-10-01 NOTE — Telephone Encounter (Signed)
Called into [facility]

## 2014-10-01 NOTE — Telephone Encounter (Signed)
Last filled 08/30/14, last seen 07/05/14. Route to pool b, call into [facility]

## 2014-10-08 ENCOUNTER — Other Ambulatory Visit: Payer: Self-pay | Admitting: Nurse Practitioner

## 2014-10-25 ENCOUNTER — Ambulatory Visit: Payer: Commercial Managed Care - HMO | Admitting: Nurse Practitioner

## 2014-10-31 ENCOUNTER — Other Ambulatory Visit: Payer: Self-pay

## 2014-10-31 MED ORDER — ALPRAZOLAM 0.5 MG PO TABS: ORAL_TABLET | ORAL | Status: DC

## 2014-10-31 NOTE — Telephone Encounter (Signed)
Last seen 07/05/14  MMM Upcoming appt. 7/29  If approved route to nurse to call into [facility]  [phone]

## 2014-10-31 NOTE — Telephone Encounter (Signed)
Refill called to [facility] 

## 2014-11-08 ENCOUNTER — Telehealth: Payer: Self-pay | Admitting: Nurse Practitioner

## 2014-11-09 NOTE — Telephone Encounter (Signed)
Christy order 90 xanax on 10/31/14- to early for refill

## 2014-11-09 NOTE — Telephone Encounter (Signed)
Patient aware.

## 2014-11-23 ENCOUNTER — Encounter (INDEPENDENT_AMBULATORY_CARE_PROVIDER_SITE_OTHER): Payer: Self-pay

## 2014-11-23 ENCOUNTER — Encounter: Payer: Self-pay | Admitting: Nurse Practitioner

## 2014-11-23 ENCOUNTER — Ambulatory Visit (INDEPENDENT_AMBULATORY_CARE_PROVIDER_SITE_OTHER): Payer: Commercial Managed Care - HMO | Admitting: Nurse Practitioner

## 2014-11-23 VITALS — BP 107/65 | HR 77 | Temp 97.0°F | Ht 71.0 in | Wt 191.0 lb

## 2014-11-23 DIAGNOSIS — F411 Generalized anxiety disorder: Secondary | ICD-10-CM | POA: Diagnosis not present

## 2014-11-23 DIAGNOSIS — K219 Gastro-esophageal reflux disease without esophagitis: Secondary | ICD-10-CM | POA: Diagnosis not present

## 2014-11-23 DIAGNOSIS — I25111 Atherosclerotic heart disease of native coronary artery with angina pectoris with documented spasm: Secondary | ICD-10-CM

## 2014-11-23 DIAGNOSIS — Z125 Encounter for screening for malignant neoplasm of prostate: Secondary | ICD-10-CM | POA: Diagnosis not present

## 2014-11-23 DIAGNOSIS — F329 Major depressive disorder, single episode, unspecified: Secondary | ICD-10-CM

## 2014-11-23 DIAGNOSIS — Z6826 Body mass index (BMI) 26.0-26.9, adult: Secondary | ICD-10-CM

## 2014-11-23 DIAGNOSIS — E785 Hyperlipidemia, unspecified: Secondary | ICD-10-CM

## 2014-11-23 DIAGNOSIS — I10 Essential (primary) hypertension: Secondary | ICD-10-CM

## 2014-11-23 DIAGNOSIS — F32A Depression, unspecified: Secondary | ICD-10-CM

## 2014-11-23 MED ORDER — LISINOPRIL 10 MG PO TABS: ORAL_TABLET | ORAL | Status: DC

## 2014-11-23 MED ORDER — SERTRALINE HCL 100 MG PO TABS: ORAL_TABLET | ORAL | Status: DC

## 2014-11-23 MED ORDER — PRAVASTATIN SODIUM 40 MG PO TABS: 40.0000 mg | ORAL_TABLET | Freq: Every day | ORAL | Status: DC

## 2014-11-23 MED ORDER — AMLODIPINE BESYLATE 10 MG PO TABS: 10.0000 mg | ORAL_TABLET | Freq: Every day | ORAL | Status: DC

## 2014-11-23 MED ORDER — ALPRAZOLAM 0.5 MG PO TABS: ORAL_TABLET | ORAL | Status: DC

## 2014-11-23 MED ORDER — OMEPRAZOLE 20 MG PO CPDR: DELAYED_RELEASE_CAPSULE | ORAL | Status: DC

## 2014-11-23 NOTE — Patient Instructions (Signed)

## 2014-11-23 NOTE — Addendum Note (Signed)
Addended by: Prescott Gum on: 11/23/2014 03:15 PM   Modules accepted: Orders

## 2014-11-23 NOTE — Progress Notes (Signed)
Subjective:    Patient ID: Albert [name], male    DOB: [birth year], 69 y.o.   MRN: [id number]  Patient is here today for chronic visit follow up. Complaint of area  on right thigh sore to touch   Hypertension This is a chronic problem. The current episode started more than 1 year ago. The problem is uncontrolled. Pertinent negatives include no chest pain, headaches or shortness of breath. Risk factors for coronary artery disease include dyslipidemia, male gender, post-menopausal state and sedentary lifestyle. Past treatments include ACE inhibitors and calcium channel blockers. The current treatment provides moderate improvement. There are no compliance problems.   Hyperlipidemia This is a chronic problem. The current episode started more than 1 year ago. The problem is controlled. Recent lipid tests were reviewed and are variable. He has no history of diabetes, hypothyroidism or obesity. Pertinent negatives include no chest pain or shortness of breath. Current antihyperlipidemic treatment includes statins. The current treatment provides moderate improvement of lipids. Compliance problems include adherence to diet and adherence to exercise.  Risk factors for coronary artery disease include dyslipidemia, hypertension and male sex.  GERD Patient is taking omeprazole daily- works well- no heartburn Depression/GAD Patient currently on sertraline and xanax- has been anxious and depressed since wife passed away- wants to cut back on xanax. CAD Sees dr. Harl Bowie every 6 months     Review of Systems  Constitutional: Negative.   HENT: Negative.   Eyes: Negative.   Respiratory: Negative for shortness of breath.   Cardiovascular: Negative for chest pain.  Genitourinary: Negative.   Neurological: Negative for headaches.  Psychiatric/Behavioral: Negative.   All other systems reviewed and are negative.      Objective:   Physical Exam  Constitutional: He is oriented to person, place, and time. He  appears well-developed and well-nourished.  HENT:  Head: Normocephalic.  Right Ear: External ear normal.  Left Ear: External ear normal.  Nose: Nose normal.  Mouth/Throat: Oropharynx is clear and moist.  Eyes: EOM are normal. Pupils are equal, round, and reactive to light.  Neck: Normal range of motion. Neck supple. No JVD present. No thyromegaly present.  Cardiovascular: Normal rate, regular rhythm, normal heart sounds and intact distal pulses.  Exam reveals no gallop and no friction rub.   No murmur heard. Pulmonary/Chest: Effort normal and breath sounds normal. No respiratory distress. He has no wheezes. He has no rales. He exhibits no tenderness.  Abdominal: Soft. Bowel sounds are normal. He exhibits no mass. There is no tenderness.  Musculoskeletal: Normal range of motion. He exhibits no edema.  Lymphadenopathy:    He has no cervical adenopathy.  Neurological: He is alert and oriented to person, place, and time. No cranial nerve deficit.  Skin: Skin is warm and dry.  Contusion on right lateral lower thigh- slightly tender to touch. No edema  Psychiatric: He has a normal mood and affect. His behavior is normal. Judgment and thought content normal.   BP 107/65 mmHg  Pulse 77  Temp(Src) 97 F (36.1 C) (Oral)  Ht 5' 11"  (1.803 m)  Wt 191 lb (86.637 kg)  BMI 26.65 kg/m2       Assessment & Plan:    1. Essential hypertension, benign Do not add salt to diet - lisinopril (PRINIVIL,ZESTRIL) 10 MG tablet; TAKE 1 TABLET EVERY DAY  (DOSE INCREASE 10/6 AND DISCONTINUE ATENOLOL)  Dispense: 90 tablet; Refill: 1 - amLODipine (NORVASC) 10 MG tablet; Take 1 tablet (10 mg total) by mouth daily.  Dispense:  90 tablet; Refill: 1 - CMP14+EGFR  2. Coronary artery disease involving native coronary artery of native heart with angina pectoris with documented spasm  3. Gastroesophageal reflux disease without esophagitis Avoid spicy foods Do not eat 2 hours prior to bedtime - omeprazole  (PRILOSEC) 20 MG capsule; TAKE 1 CAPSULE TWICE DAILY  BEFORE  A  MEAL  Dispense: 180 capsule; Refill: 1  4. Hyperlipidemia with target LDL less than 100 Low fat diet - pravastatin (PRAVACHOL) 40 MG tablet; Take 1 tablet (40 mg total) by mouth daily.  Dispense: 90 tablet; Refill: 1 - Lipid panel  5. GAD (generalized anxiety disorder) Stress management - ALPRAZolam (XANAX) 0.5 MG tablet; TAKE 1 TABLET EVERY 8 HOURS AS NEEDED FOR ANXIETY.  Dispense: 90 tablet; Refill: 0  6. Depression - sertraline (ZOLOFT) 100 MG tablet; TAKE 1 TABLET TWICE DAILY FOR ANXIETY AND DEPRESSION  Dispense: 180 tablet; Refill: 1  7. BMI 26.0-26.9,adult Discussed diet and exercise for person with BMI >25 Will recheck weight in 3-6 months   8. Prostate cancer screening - PSA Total+%Free (Serial)    Labs pending Health maintenance reviewed Diet and exercise encouraged  Continue all meds Follow up  In 6 months  [place], FNP

## 2014-11-24 LAB — CMP14+EGFR
ALT: 28 IU/L (ref 0–44)
AST: 22 IU/L (ref 0–40)
Albumin/Globulin Ratio: 1.8 (ref 1.1–2.5)
Albumin: 4.4 g/dL (ref 3.6–4.8)
Alkaline Phosphatase: 91 IU/L (ref 39–117)
BUN/Creatinine Ratio: 14 (ref 10–22)
BUN: 12 mg/dL (ref 8–27)
Bilirubin Total: 0.4 mg/dL (ref 0.0–1.2)
CO2: 23 mmol/L (ref 18–29)
Calcium: 9 mg/dL (ref 8.6–10.2)
Chloride: 99 mmol/L (ref 97–108)
Creatinine, Ser: 0.88 mg/dL (ref 0.76–1.27)
GFR calc Af Amer: 103 mL/min/{1.73_m2} (ref >59)
GFR, EST NON AFRICAN AMERICAN: 90 mL/min/{1.73_m2} (ref >59)
GLOBULIN, TOTAL: 2.5 g/dL (ref 1.5–4.5)
Glucose: 77 mg/dL (ref 65–99)
Potassium: 4 mmol/L (ref 3.5–5.2)
SODIUM: 141 mmol/L (ref 134–144)
TOTAL PROTEIN: 6.9 g/dL (ref 6.0–8.5)

## 2014-11-24 LAB — PSA, TOTAL AND FREE
PSA, Free Pct: 25.7 %
PSA, Free: 0.59 ng/mL
Prostate Specific Ag, Serum: 2.3 ng/mL (ref 0.0–4.0)

## 2014-11-24 LAB — LIPID PANEL
CHOLESTEROL TOTAL: 165 mg/dL (ref 100–199)
Chol/HDL Ratio: 3.8 ratio units (ref 0.0–5.0)
HDL: 43 mg/dL (ref >39)
LDL CALC: 68 mg/dL (ref 0–99)
TRIGLYCERIDES: 270 mg/dL — AB (ref 0–149)
VLDL CHOLESTEROL CAL: 54 mg/dL — AB (ref 5–40)

## 2014-11-27 ENCOUNTER — Telehealth: Payer: Self-pay | Admitting: *Deleted

## 2014-11-27 NOTE — Telephone Encounter (Signed)
-----   Message from [facility], FNP sent at 11/26/2014 11:07 AM EDT ----- Kidney and liver function stable Trig elevated- decrease carbs in diet PSA up slightly Continue current meds- low fat diet and exercise and recheck in 3 months

## 2014-12-10 ENCOUNTER — Telehealth: Payer: Self-pay | Admitting: Nurse Practitioner

## 2014-12-11 ENCOUNTER — Other Ambulatory Visit: Payer: Self-pay | Admitting: Nurse Practitioner

## 2014-12-11 DIAGNOSIS — F411 Generalized anxiety disorder: Secondary | ICD-10-CM

## 2014-12-11 MED ORDER — ALPRAZOLAM 0.5 MG PO TABS: ORAL_TABLET | ORAL | Status: DC

## 2014-12-11 NOTE — Telephone Encounter (Signed)
Per Paulene Floor, FNP ok to refill and call into [facility] pharmacy. Patients rx was not called in on 7/29. Patient aware that Paulene Floor, FNP will only do a month supply and requested we call in prescription to [facility].

## 2014-12-11 NOTE — Telephone Encounter (Signed)
Xanax was just refilled on 12/03/14- to early to get filled right  now

## 2014-12-27 ENCOUNTER — Other Ambulatory Visit: Payer: Self-pay

## 2014-12-27 DIAGNOSIS — F411 Generalized anxiety disorder: Secondary | ICD-10-CM

## 2014-12-27 MED ORDER — ALPRAZOLAM 0.5 MG PO TABS: ORAL_TABLET | ORAL | Status: DC

## 2014-12-27 NOTE — Telephone Encounter (Signed)
Please call in xanax with 0 refills 

## 2014-12-27 NOTE — Telephone Encounter (Signed)
Last seen 7/16 MMM  Last filled 12/03/14  If approved route to  Nurse to call into [facility] [phone]

## 2015-01-11 ENCOUNTER — Ambulatory Visit: Payer: Commercial Managed Care - HMO | Admitting: Pharmacist

## 2015-01-14 ENCOUNTER — Other Ambulatory Visit: Payer: Self-pay | Admitting: Nurse Practitioner

## 2015-01-23 ENCOUNTER — Other Ambulatory Visit: Payer: Self-pay | Admitting: Nurse Practitioner

## 2015-01-24 NOTE — Telephone Encounter (Signed)
He had #90 tab prescription should be good for 30 days dated 12/28/2014 that he filled on 01/03/2015. He is not due for another refill yet.

## 2015-01-24 NOTE — Telephone Encounter (Signed)
Last seen 11/23/14 MMM If approved route to nurse to call into [facility]

## 2015-01-25 ENCOUNTER — Encounter (INDEPENDENT_AMBULATORY_CARE_PROVIDER_SITE_OTHER): Payer: Self-pay

## 2015-01-25 ENCOUNTER — Ambulatory Visit (INDEPENDENT_AMBULATORY_CARE_PROVIDER_SITE_OTHER): Payer: Commercial Managed Care - HMO | Admitting: Pharmacist

## 2015-01-25 ENCOUNTER — Encounter: Payer: Self-pay | Admitting: Pharmacist

## 2015-01-25 VITALS — BP 126/80 | HR 72 | Ht 70.0 in | Wt 189.5 lb

## 2015-01-25 DIAGNOSIS — F411 Generalized anxiety disorder: Secondary | ICD-10-CM

## 2015-01-25 DIAGNOSIS — M792 Neuralgia and neuritis, unspecified: Secondary | ICD-10-CM | POA: Diagnosis not present

## 2015-01-25 DIAGNOSIS — Z1211 Encounter for screening for malignant neoplasm of colon: Secondary | ICD-10-CM

## 2015-01-25 DIAGNOSIS — Z9181 History of falling: Secondary | ICD-10-CM | POA: Insufficient documentation

## 2015-01-25 DIAGNOSIS — Z0000 Encounter for general adult medical examination without abnormal findings: Secondary | ICD-10-CM

## 2015-01-25 DIAGNOSIS — R5382 Chronic fatigue, unspecified: Secondary | ICD-10-CM

## 2015-01-25 MED ORDER — ALPRAZOLAM 0.5 MG PO TABS: ORAL_TABLET | ORAL | Status: DC

## 2015-01-25 MED ORDER — QUAD CANE MISC: Status: AC

## 2015-01-25 NOTE — Progress Notes (Signed)
Patient ID: Albert [name], male   DOB: [birth year], 69 y.o.   MRN: [id number]    Subjective:   Albert [name] is a 69 y.o. white male who presents for an Initial Medicare Annual Wellness Visit. Patient is widowed and lives alone in [place], [place].  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  His only concern is occasional tingling on the top of his left foot which mostly occurs during the night.  He said is wakes him and is tender to the touch.   Current Medications (verified) Outpatient Encounter Prescriptions as of 01/25/2015  Medication Sig  . ALPRAZolam (XANAX) 0.5 MG tablet TAKE 1 TABLET EVERY 8 HOURS AS NEEDED FOR ANXIETY.  Marland Kitchen amLODipine (NORVASC) 10 MG tablet Take 1 tablet (10 mg total) by mouth daily.  Marland Kitchen aspirin 81 MG tablet Take 81 mg by mouth 2 (two) times daily.   Marland Kitchen lisinopril (PRINIVIL,ZESTRIL) 10 MG tablet TAKE 1 TABLET EVERY DAY  (DOSE INCREASE 10/6 AND DISCONTINUE ATENOLOL)  . omeprazole (PRILOSEC) 20 MG capsule TAKE 1 CAPSULE TWICE DAILY  BEFORE  A  MEAL  . pravastatin (PRAVACHOL) 40 MG tablet Take 1 tablet (40 mg total) by mouth daily.  . sertraline (ZOLOFT) 100 MG tablet TAKE 1 TABLET TWICE DAILY FOR ANXIETY AND DEPRESSION  . TYLENOL 500 MG tablet Take 1 tablet by mouth 2 (two) times daily.  . [DISCONTINUED] ALPRAZolam (XANAX) 0.5 MG tablet TAKE 1 TABLET EVERY 8 HOURS AS NEEDED FOR ANXIETY.  . Misc. Devices (QUAD CANE) MISC Use to balance and to prevent falls (Dx R29.6 - high fall risk)   No facility-administered encounter medications on file as of 01/25/2015.    Allergies (verified) Diovan; Hydrocodone; and Penicillins   History: Past Medical History  Diagnosis Date  . Anxiety   . Depression   . Hypertension   . Hyperlipidemia   . GERD (gastroesophageal reflux disease)   . Stroke 2008  . Myocardial infarction 2008  . Heart murmur    Past Surgical History  Procedure Laterality Date  . Appendectomy  1980   Family History  Problem  Relation Age of Onset  . Heart disease Mother   . Hypertension Mother   . Heart disease Father   . Cancer Father   . Hypertension Brother   . Heart disease Brother    Social History   Occupational History  . Not on file.   Social History Main Topics  . Smoking status: Never Smoker   . Smokeless tobacco: Never Used  . Alcohol Use: No  . Drug Use: No  . Sexual Activity: No    Do you feel safe at home?  No  Dietary issues and exercise activities: Current Exercise Habits:: Exercise is limited by, Limited by:: orthopedic condition(s)  Current Dietary habits:  Not following any specific dietary recommendations / restrictions at this time.   Objective:    Today's Vitals   01/25/15 1440  BP: 126/80  Pulse: 72  Height:  (1.778 m)  Weight: 189 lb 8 oz (85.957 kg)  PainSc: 2   PainLoc: Foot   Body mass index is 27.19 kg/(m^2).  Activities of Daily Living In your present state of health, do you have any difficulty performing the following activities: 01/25/2015 03/12/2014  Hearing? N N  Vision? N N  Difficulty concentrating or making decisions? N N  Walking or climbing stairs? Y N  Dressing or bathing? N N  Doing errands, shopping? N N  Preparing  Food and eating ? N -  Using the Toilet? N -  In the past six months, have you accidently leaked urine? N -  Do you have problems with loss of bowel control? N -  Managing your Medications? N -  Managing your Finances? N -  Housekeeping or managing your Housekeeping? N -    Are there smokers in your home (other than you)? No   Cardiac Risk Factors include: advanced age (>93men, >87 women);dyslipidemia;family history of premature cardiovascular disease;male gender;hypertension;sedentary lifestyle  Depression Screen PHQ 2/9 Scores 01/25/2015 11/23/2014 07/05/2014 03/12/2014  PHQ - 2 Score 1 0 0 0    Fall Risk Fall Risk  01/25/2015 11/23/2014 07/05/2014 03/12/2014 03/12/2014  Falls in the past year? Yes Yes Yes Yes Yes    Number falls in past yr: 2 or more 2 or more 2 or more 2 or more 2 or more  Injury with Fall? Yes No No No -  Risk Factor Category  High Fall Risk - - - -  Risk for fall due to : Impaired balance/gait - - - Impaired balance/gait  Follow up Falls evaluation completed;Falls prevention discussed - - - -   Patient reports that he was referred to physical therapy for balance assessment and strengthening but he felt that after sessions were over he had not improved much.   Cognitive Function: MMSE - Mini Mental State Exam 01/25/2015  Orientation to time 5  Orientation to Place 5  Registration 3  Attention/ Calculation 5  Recall 3  Language- name 2 objects 2  Language- repeat 1  Language- follow 3 step command 3  Language- read & follow direction 1  Write a sentence 1  Copy design 0  Total score 29    Immunizations and Health Maintenance Immunization History  Administered Date(s) Administered  . Influenza,inj,Quad PF,36+ Mos 03/12/2014, 01/25/2015  . Pneumococcal Conjugate-13 07/05/2014   Health Maintenance Due  Topic Date Due  . INFLUENZA VACCINE  11/26/2014    Patient Care Team: Bennie Pierini, FNP as PCP - General (Nurse Practitioner) Antoine Poche, MD as Consulting Physician (Cardiology)  Indicate any recent Medical Services you may have received from other than [facility] providers in the past year (date may be approximate).    Assessment:    Annual Wellness Visit  paresthesia of right foot High fall risk  High risk medication    Screening Tests Health Maintenance  Topic Date Due  . INFLUENZA VACCINE  11/26/2014  . ZOSTAVAX  04/30/2015 (Originally 12/27/2007)  . TETANUS/TDAP  05/26/2015 (Originally 12/27/1966)  . PNA vac Low Risk Adult (2 of 2 - PPSV23) 07/05/2015  . COLONOSCOPY  12/08/2022  . Hepatitis C Screening  Completed        Plan:   During the course of the visit Albert [name] was educated and counseled about the following appropriate screening and  preventive services:   Vaccines to include Pneumoccal, Influenza, Hepatitis B, Td, Zostavax - received influenza vaccine.  Postponed Zostavax and Tdap until January 2017 due to cost.   Discussed fall prevention  Rx for Quad cane   Patient declined referral to PT  Discussed cutting back on alprazolam.  Patient is very nervous because he has been without for 2 days.  Rx is sent in but will try to taper patient off as this medication can increase falls.   Colorectal cancer screening - FOBT given in office today;  Colonoscopy UTD.  Cardiovascular disease screening - LDL at goal but Tg elevated.  Patient is  taking pravastatin.  BP at goal today.  EKG is UTD.    Diabetes screening - UTD  PSA - UTD  Nutrition counseling - Discussed limiting high CHO and high fat foods to help with weight and Triglycerides.    Advanced Directives - patient given our fax number to send AD to [place].  Chair exercises reviewed and handout given to patient.  Orders Placed This Encounter  Procedures  . Fecal occult blood, imunochemical  . Flu Vaccine QUAD 36+ mos IM  . Hepatitis C antibody  . Anemia Profile B  . Uric acid  . Vit D  25 hydroxy (rtn osteoporosis monitoring)     Patient Instructions (the written plan) were given to the patient.   Henrene Pastor, PHARMD, CPP, CDE   01/25/2015

## 2015-01-25 NOTE — Patient Instructions (Addendum)
Albert [name] , Thank you for taking time to come for your Medicare Wellness Visit. I appreciate your ongoing commitment to your health goals. Please review the following plan we discussed and let me know if I can assist you in the future.   These are the goals we discussed:  Increase physical activity as able - consider Silver Sneaker program or chair exercise given .   Increase non-starchy vegetables - carrots, green bean, squash, zucchini, tomatoes, onions, peppers, spinach and other green leafy vegetables, cabbage, lettuce, cucumbers, asparagus, okra (not fried), eggplant limit sugar and processed foods (cakes, cookies, ice cream, crackers and chips) Increase fresh fruit but limit serving sizes 1/2 cup or about the size of tennis or baseball limit red meat to no more than 1-2 times per week (serving size about the size of your palm) Choose whole grains / lean proteins - whole wheat bread, quinoa, whole grain rice (1/2 cup), fish, chicken, [place]  Can fax in Healthcare Power of [place] and Living Will to our office for records - fax to [phone]   This is a list of the screening recommended for you and due dates:  Health Maintenance  Topic Date Due  .  Hepatitis C: One time screening is recommended by Center for Disease Control  (CDC) for  adults born from [birth year] through 1963.   Done today  . Shingles Vaccine  12/27/2007 - cost would be $45  . Flu Shot  11/26/2015 - done today  . Tetanus Vaccine  05/26/2015 - cost would be $60  . Pneumonia vaccines (2 of 2 - PPSV23) 07/05/2015  . Colon Cancer Screening  12/08/2022  *Topic was postponed. The date shown is not the original due date.    Fall Prevention and Home Safety Falls cause injuries and can affect all age groups. It is possible to use preventive measures to significantly decrease the likelihood of falls. There are many simple measures which can make your home safer and prevent falls. OUTDOORS  Repair cracks and edges of walkways  and driveways.  Remove high doorway thresholds.  Trim shrubbery on the main path into your home.  Have good outside lighting.  Clear walkways of tools, rocks, debris, and clutter.  Check that handrails are not broken and are securely fastened. Both sides of steps should have handrails.  Have leaves, snow, and ice cleared regularly.  Use sand or salt on walkways during winter months.  In the garage, clean up grease or oil spills. BATHROOM  Install night lights.  Install grab bars by the toilet and in the tub and shower.  Use non-skid mats or decals in the tub or shower.  Place a plastic non-slip stool in the shower to sit on, if needed.  Keep floors dry and clean up all water on the floor immediately.  Remove soap buildup in the tub or shower on a regular basis.  Secure bath mats with non-slip, double-sided rug tape.  Remove throw rugs and tripping hazards from the floors. BEDROOMS  Install night lights.  Make sure a bedside light is easy to reach.  Do not use oversized bedding.  Keep a telephone by your bedside.  Have a firm chair with side arms to use for getting dressed.  Remove throw rugs and tripping hazards from the floor. KITCHEN  Keep handles on pots and pans turned toward the center of the stove. Use back burners when possible.  Clean up spills quickly and allow time for drying.  Avoid walking on wet floors.  Avoid hot utensils and knives.  Position shelves so they are not too high or low.  Place commonly used objects within easy reach.  If necessary, use a sturdy step stool with a grab bar when reaching.  Keep electrical cables out of the way.  Do not use floor polish or wax that makes floors slippery. If you must use wax, use non-skid floor wax.  Remove throw rugs and tripping hazards from the floor. STAIRWAYS  Never leave objects on stairs.  Place handrails on both sides of stairways and use them. Fix any loose handrails. Make sure  handrails on both sides of the stairways are as long as the stairs.  Check carpeting to make sure it is firmly attached along stairs. Make repairs to worn or loose carpet promptly.  Avoid placing throw rugs at the top or bottom of stairways, or properly secure the rug with carpet tape to prevent slippage. Get rid of throw rugs, if possible.  Have an electrician put in a light switch at the top and bottom of the stairs. OTHER FALL PREVENTION TIPS  Wear low-heel or rubber-soled shoes that are supportive and fit well. Wear closed toe shoes.  When using a stepladder, make sure it is fully opened and both spreaders are firmly locked. Do not climb a closed stepladder.  Add color or contrast paint or tape to grab bars and handrails in your home. Place contrasting color strips on first and last steps.  Learn and use mobility aids as needed. Install an electrical emergency response system.  Turn on lights to avoid dark areas. Replace light bulbs that burn out immediately. Get light switches that glow.  Arrange furniture to create clear pathways. Keep furniture in the same place.  Firmly attach carpet with non-skid or double-sided tape.  Eliminate uneven floor surfaces.  Select a carpet pattern that does not visually hide the edge of steps.  Be aware of all pets.  They can unintentionally trip you up.  OTHER HOME SAFETY TIPS  Set the water temperature for 120 F (48.8 C).  Keep emergency numbers on or near the telephone.  Keep smoke detectors on every level of the home and near sleeping areas. Document Released: 04/03/2002 Document Revised: 10/13/2011 Document Reviewed: 07/03/2011 [facility] Patient Information 2015 [place], [place]. This information is not intended to replace advice given to you by your health care provider. Make sure you discuss any questions you have with your health care provider.

## 2015-01-26 LAB — ANEMIA PROFILE B
BASOS: 0 %
Basophils Absolute: 0 10*3/uL (ref 0.0–0.2)
EOS (ABSOLUTE): 0.1 10*3/uL (ref 0.0–0.4)
EOS: 1 %
FERRITIN: 229 ng/mL (ref 30–400)
Folate: 8.9 ng/mL (ref >3.0)
HEMOGLOBIN: 14.4 g/dL (ref 12.6–17.7)
Hematocrit: 44.1 % (ref 37.5–51.0)
IRON SATURATION: 21 % (ref 15–55)
IRON: 61 ug/dL (ref 38–169)
Immature Grans (Abs): 0 10*3/uL (ref 0.0–0.1)
Immature Granulocytes: 0 %
LYMPHS ABS: 2.5 10*3/uL (ref 0.7–3.1)
Lymphs: 31 %
MCH: 29.4 pg (ref 26.6–33.0)
MCHC: 32.7 g/dL (ref 31.5–35.7)
MCV: 90 fL (ref 79–97)
Monocytes Absolute: 0.7 10*3/uL (ref 0.1–0.9)
Monocytes: 9 %
Neutrophils Absolute: 4.7 10*3/uL (ref 1.4–7.0)
Neutrophils: 59 %
PLATELETS: 251 10*3/uL (ref 150–379)
RBC: 4.9 x10E6/uL (ref 4.14–5.80)
RDW: 13.9 % (ref 12.3–15.4)
Retic Ct Pct: 1.3 % (ref 0.6–2.6)
Total Iron Binding Capacity: 296 ug/dL (ref 250–450)
UIBC: 235 ug/dL (ref 111–343)
VITAMIN B 12: 234 pg/mL (ref 211–946)
WBC: 8 10*3/uL (ref 3.4–10.8)

## 2015-01-26 LAB — URIC ACID: Uric Acid: 5.9 mg/dL (ref 3.7–8.6)

## 2015-01-26 LAB — HEPATITIS C ANTIBODY: Hep C Virus Ab: <0.1 s/co ratio (ref 0.0–0.9)

## 2015-01-26 LAB — VITAMIN D 25 HYDROXY (VIT D DEFICIENCY, FRACTURES): VIT D 25 HYDROXY: 16.8 ng/mL — AB (ref 30.0–100.0)

## 2015-01-28 ENCOUNTER — Other Ambulatory Visit: Payer: Self-pay | Admitting: Pharmacist

## 2015-01-28 MED ORDER — VITAMIN D (ERGOCALCIFEROL) 1.25 MG (50000 UNIT) PO CAPS: 50000.0000 [IU] | ORAL_CAPSULE | ORAL | Status: DC

## 2015-01-29 ENCOUNTER — Telehealth: Payer: Self-pay | Admitting: Nurse Practitioner

## 2015-01-29 ENCOUNTER — Other Ambulatory Visit: Payer: Commercial Managed Care - HMO

## 2015-01-29 DIAGNOSIS — Z1212 Encounter for screening for malignant neoplasm of rectum: Secondary | ICD-10-CM | POA: Diagnosis not present

## 2015-01-29 NOTE — Progress Notes (Signed)
Lab only 

## 2015-01-31 LAB — FECAL OCCULT BLOOD, IMMUNOCHEMICAL: Fecal Occult Bld: POSITIVE — AB

## 2015-02-01 ENCOUNTER — Other Ambulatory Visit (INDEPENDENT_AMBULATORY_CARE_PROVIDER_SITE_OTHER): Payer: Commercial Managed Care - HMO

## 2015-02-01 DIAGNOSIS — R296 Repeated falls: Secondary | ICD-10-CM | POA: Diagnosis not present

## 2015-02-01 DIAGNOSIS — R799 Abnormal finding of blood chemistry, unspecified: Secondary | ICD-10-CM | POA: Diagnosis not present

## 2015-02-02 LAB — CBC WITH DIFFERENTIAL/PLATELET
BASOS: 0 %
Basophils Absolute: 0 10*3/uL (ref 0.0–0.2)
EOS (ABSOLUTE): 0.1 10*3/uL (ref 0.0–0.4)
Eos: 1 %
Hematocrit: 43.6 % (ref 37.5–51.0)
Hemoglobin: 14.6 g/dL (ref 12.6–17.7)
IMMATURE GRANS (ABS): 0 10*3/uL (ref 0.0–0.1)
IMMATURE GRANULOCYTES: 0 %
LYMPHS: 38 %
Lymphocytes Absolute: 2.7 10*3/uL (ref 0.7–3.1)
MCH: 29.7 pg (ref 26.6–33.0)
MCHC: 33.5 g/dL (ref 31.5–35.7)
MCV: 89 fL (ref 79–97)
Monocytes Absolute: 0.4 10*3/uL (ref 0.1–0.9)
Monocytes: 6 %
NEUTROS PCT: 55 %
Neutrophils Absolute: 3.9 10*3/uL (ref 1.4–7.0)
PLATELETS: 250 10*3/uL (ref 150–379)
RBC: 4.92 x10E6/uL (ref 4.14–5.80)
RDW: 14 % (ref 12.3–15.4)
WBC: 7.1 10*3/uL (ref 3.4–10.8)

## 2015-02-07 ENCOUNTER — Other Ambulatory Visit: Payer: Commercial Managed Care - HMO

## 2015-02-07 DIAGNOSIS — Z1212 Encounter for screening for malignant neoplasm of rectum: Secondary | ICD-10-CM | POA: Diagnosis not present

## 2015-02-07 NOTE — Progress Notes (Signed)
Lab only 

## 2015-02-10 LAB — FECAL OCCULT BLOOD, IMMUNOCHEMICAL: Fecal Occult Bld: POSITIVE — AB

## 2015-02-12 ENCOUNTER — Telehealth: Payer: Self-pay | Admitting: Nurse Practitioner

## 2015-02-12 DIAGNOSIS — K921 Melena: Secondary | ICD-10-CM

## 2015-02-12 NOTE — Telephone Encounter (Signed)
Need to find out because should not have blood in stool unless has hemorrhoids.

## 2015-02-12 NOTE — Telephone Encounter (Signed)
-----   Message from Bennie PieriniMary-Margaret Martin, FNP sent at 02/11/2015  1:07 PM EDT ----- Who did he see in the past so I can do referral

## 2015-02-12 NOTE — Telephone Encounter (Signed)
Patient stated that he was going to the [facility] for his medical care but does not want to go there anymore.  Referral sent for Colonoscopy

## 2015-02-12 NOTE — Telephone Encounter (Signed)
Patient states he previously went to the [facility] for his colonoscopy and he believes he had one last year

## 2015-03-05 ENCOUNTER — Other Ambulatory Visit: Payer: Self-pay | Admitting: Nurse Practitioner

## 2015-03-06 NOTE — Telephone Encounter (Signed)
Last filled 01/25/15, last seen 11/23/14. [facility]

## 2015-03-06 NOTE — Telephone Encounter (Signed)
rx called into pharmacy

## 2015-03-06 NOTE — Telephone Encounter (Signed)
Please call in xanax with 1 refills 

## 2015-03-11 ENCOUNTER — Encounter: Payer: Self-pay | Admitting: Gastroenterology

## 2015-04-23 ENCOUNTER — Encounter: Payer: Self-pay | Admitting: Nurse Practitioner

## 2015-04-23 ENCOUNTER — Ambulatory Visit (INDEPENDENT_AMBULATORY_CARE_PROVIDER_SITE_OTHER): Payer: Commercial Managed Care - HMO | Admitting: Nurse Practitioner

## 2015-04-23 VITALS — BP 138/83 | HR 79 | Temp 97.5°F | Ht 70.0 in | Wt 186.0 lb

## 2015-04-23 DIAGNOSIS — K219 Gastro-esophageal reflux disease without esophagitis: Secondary | ICD-10-CM | POA: Diagnosis not present

## 2015-04-23 DIAGNOSIS — Z6826 Body mass index (BMI) 26.0-26.9, adult: Secondary | ICD-10-CM | POA: Diagnosis not present

## 2015-04-23 DIAGNOSIS — I10 Essential (primary) hypertension: Secondary | ICD-10-CM

## 2015-04-23 DIAGNOSIS — F411 Generalized anxiety disorder: Secondary | ICD-10-CM | POA: Diagnosis not present

## 2015-04-23 DIAGNOSIS — F329 Major depressive disorder, single episode, unspecified: Secondary | ICD-10-CM | POA: Diagnosis not present

## 2015-04-23 DIAGNOSIS — I25111 Atherosclerotic heart disease of native coronary artery with angina pectoris with documented spasm: Secondary | ICD-10-CM | POA: Diagnosis not present

## 2015-04-23 DIAGNOSIS — E785 Hyperlipidemia, unspecified: Secondary | ICD-10-CM | POA: Diagnosis not present

## 2015-04-23 DIAGNOSIS — R05 Cough: Secondary | ICD-10-CM | POA: Diagnosis not present

## 2015-04-23 DIAGNOSIS — F32A Depression, unspecified: Secondary | ICD-10-CM

## 2015-04-23 DIAGNOSIS — R059 Cough, unspecified: Secondary | ICD-10-CM

## 2015-04-23 MED ORDER — AMLODIPINE BESYLATE 10 MG PO TABS
10.0000 mg | ORAL_TABLET | Freq: Every day | ORAL | Status: AC
Start: 2015-04-23 — End: ?

## 2015-04-23 MED ORDER — OMEPRAZOLE 20 MG PO CPDR: DELAYED_RELEASE_CAPSULE | ORAL | Status: DC

## 2015-04-23 MED ORDER — ALPRAZOLAM 0.5 MG PO TABS: 0.5000 mg | ORAL_TABLET | Freq: Three times a day (TID) | ORAL | Status: DC | PRN

## 2015-04-23 MED ORDER — LISINOPRIL 10 MG PO TABS: ORAL_TABLET | ORAL | Status: DC

## 2015-04-23 MED ORDER — PRAVASTATIN SODIUM 40 MG PO TABS: 40.0000 mg | ORAL_TABLET | Freq: Every day | ORAL | Status: AC

## 2015-04-23 MED ORDER — SERTRALINE HCL 100 MG PO TABS: ORAL_TABLET | ORAL | Status: DC

## 2015-04-23 MED ORDER — BENZONATATE 100 MG PO CAPS: 100.0000 mg | ORAL_CAPSULE | Freq: Three times a day (TID) | ORAL | Status: DC | PRN

## 2015-04-23 NOTE — Patient Instructions (Signed)

## 2015-04-23 NOTE — Progress Notes (Signed)
Subjective:    Patient ID: Albert [name], male    DOB: [birth year], 69 y.o.   MRN: [id number]  Patient is here today for chronic visit follow up.  C/O cough that started about 2 days ago- is keeping him up at night- denies fever or congestion. Describes cough as dry and hacky and nonproductive.    Hypertension This is a chronic problem. The current episode started more than 1 year ago. The problem is uncontrolled. Risk factors for coronary artery disease include dyslipidemia, male gender, post-menopausal state and sedentary lifestyle. Past treatments include ACE inhibitors and calcium channel blockers. The current treatment provides moderate improvement. There are no compliance problems.   Hyperlipidemia This is a chronic problem. The current episode started more than 1 year ago. The problem is controlled. Recent lipid tests were reviewed and are variable. He has no history of diabetes, hypothyroidism or obesity. Current antihyperlipidemic treatment includes statins. The current treatment provides moderate improvement of lipids. Compliance problems include adherence to diet and adherence to exercise.  Risk factors for coronary artery disease include dyslipidemia, hypertension and male sex.  GERD Patient is taking omeprazole daily- works well- no heartburn Depression/GAD Patient currently on sertraline and xanax- has been anxious and depressed since wife passed away- wants to cut back on xanax. CAD Sees dr. Harl Bowie every 6 months     Review of Systems  Constitutional: Negative.   HENT: Negative.   Eyes: Negative.   Genitourinary: Negative.   Psychiatric/Behavioral: Negative.   All other systems reviewed and are negative.      Objective:   Physical Exam  Constitutional: He is oriented to person, place, and time. He appears well-developed and well-nourished.  HENT:  Head: Normocephalic.  Right Ear: External ear normal.  Left Ear: External ear normal.  Nose: Nose normal.    Mouth/Throat: Oropharynx is clear and moist.  Eyes: EOM are normal. Pupils are equal, round, and reactive to light.  Neck: Normal range of motion. Neck supple. No JVD present. No thyromegaly present.  Cardiovascular: Normal rate, regular rhythm, normal heart sounds and intact distal pulses.  Exam reveals no gallop and no friction rub.   No murmur heard. Pulmonary/Chest: Effort normal and breath sounds normal. No respiratory distress. He has no wheezes. He has no rales. He exhibits no tenderness.  Abdominal: Soft. Bowel sounds are normal. He exhibits no mass. There is no tenderness.  Musculoskeletal: Normal range of motion. He exhibits no edema.  Lymphadenopathy:    He has no cervical adenopathy.  Neurological: He is alert and oriented to person, place, and time. No cranial nerve deficit.  Skin: Skin is warm and dry.  Contusion on right lateral lower thigh- slightly tender to touch. No edema  Psychiatric: He has a normal mood and affect. His behavior is normal. Judgment and thought content normal.   BP 138/83 mmHg  Pulse 79  Temp(Src) 97.5 F (36.4 C) (Oral)  Ht 5' 10"  (1.778 m)  Wt 186 lb (84.369 kg)  BMI 26.69 kg/m2       Assessment & Plan:  1. Essential hypertension, benign Do not add salt to diet - lisinopril (PRINIVIL,ZESTRIL) 10 MG tablet; TAKE 1 TABLET EVERY DAY  (DOSE INCREASE 10/6 AND DISCONTINUE ATENOLOL)  Dispense: 90 tablet; Refill: 1 - amLODipine (NORVASC) 10 MG tablet; Take 1 tablet (10 mg total) by mouth daily.  Dispense: 90 tablet; Refill: 1 - CMP14+EGFR  2. Coronary artery disease involving native coronary artery of native heart with angina pectoris with documented  spasm ([place])  3. Gastroesophageal reflux disease without esophagitis Avoid spicy foods Do not eat 2 hours prior to bedtime - omeprazole (PRILOSEC) 20 MG capsule; TAKE 1 CAPSULE TWICE DAILY  BEFORE  A  MEAL  Dispense: 180 capsule; Refill: 1  4. Hyperlipidemia with target LDL less than 100 Low fat  diet - pravastatin (PRAVACHOL) 40 MG tablet; Take 1 tablet (40 mg total) by mouth daily.  Dispense: 90 tablet; Refill: 1 - Lipid panel  5. Depression Stress management - sertraline (ZOLOFT) 100 MG tablet; TAKE 1 TABLET TWICE DAILY FOR ANXIETY AND DEPRESSION  Dispense: 180 tablet; Refill: 1  6. GAD (generalized anxiety disorder) - ALPRAZolam (XANAX) 0.5 MG tablet; Take 1 tablet (0.5 mg total) by mouth every 8 (eight) hours as needed. for anxiety  Dispense: 90 tablet; Refill: 0  7. BMI 26.0-26.9,adult Discussed diet and exercise for person with BMI >25 Will recheck weight in 3-6 months  8. Cough Force fluids Rest Cough drops as needed Cough may last 2-3 weeks. - benzonatate (TESSALON PERLES) 100 MG capsule; Take 1 capsule (100 mg total) by mouth 3 (three) times daily as needed for cough.  Dispense: 20 capsule; Refill: 0    Labs pending Health maintenance reviewed Diet and exercise encouraged Continue all meds Follow up  In 3 month   [place], FNP

## 2015-04-24 LAB — LIPID PANEL
Chol/HDL Ratio: 2.4 ratio units (ref 0.0–5.0)
Cholesterol, Total: 158 mg/dL (ref 100–199)
HDL: 65 mg/dL (ref >39)
LDL CALC: 70 mg/dL (ref 0–99)
Triglycerides: 113 mg/dL (ref 0–149)
VLDL CHOLESTEROL CAL: 23 mg/dL (ref 5–40)

## 2015-04-24 LAB — CMP14+EGFR
ALBUMIN: 4.6 g/dL (ref 3.6–4.8)
ALT: 36 IU/L (ref 0–44)
AST: 33 IU/L (ref 0–40)
Albumin/Globulin Ratio: 1.7 (ref 1.1–2.5)
Alkaline Phosphatase: 96 IU/L (ref 39–117)
BILIRUBIN TOTAL: 0.4 mg/dL (ref 0.0–1.2)
BUN / CREAT RATIO: 11 (ref 10–22)
BUN: 9 mg/dL (ref 8–27)
CHLORIDE: 101 mmol/L (ref 96–106)
CO2: 25 mmol/L (ref 18–29)
CREATININE: 0.79 mg/dL (ref 0.76–1.27)
Calcium: 9.1 mg/dL (ref 8.6–10.2)
GFR calc non Af Amer: 93 mL/min/{1.73_m2} (ref >59)
GFR, EST AFRICAN AMERICAN: 107 mL/min/{1.73_m2} (ref >59)
GLOBULIN, TOTAL: 2.7 g/dL (ref 1.5–4.5)
GLUCOSE: 88 mg/dL (ref 65–99)
Potassium: 4.6 mmol/L (ref 3.5–5.2)
SODIUM: 144 mmol/L (ref 134–144)
TOTAL PROTEIN: 7.3 g/dL (ref 6.0–8.5)

## 2015-05-01 ENCOUNTER — Other Ambulatory Visit: Payer: Self-pay

## 2015-05-09 ENCOUNTER — Encounter: Payer: Self-pay | Admitting: Gastroenterology

## 2015-05-13 ENCOUNTER — Ambulatory Visit: Payer: Commercial Managed Care - HMO | Admitting: Gastroenterology

## 2015-05-31 ENCOUNTER — Ambulatory Visit: Payer: Commercial Managed Care - HMO | Admitting: Nurse Practitioner

## 2015-06-03 ENCOUNTER — Ambulatory Visit: Payer: Commercial Managed Care - HMO | Admitting: Nurse Practitioner

## 2015-06-03 ENCOUNTER — Encounter: Payer: Self-pay | Admitting: Nurse Practitioner

## 2015-06-04 ENCOUNTER — Encounter: Payer: Self-pay | Admitting: Nurse Practitioner

## 2015-06-14 ENCOUNTER — Ambulatory Visit: Payer: Commercial Managed Care - HMO | Admitting: Nurse Practitioner

## 2015-06-28 ENCOUNTER — Ambulatory Visit: Payer: Commercial Managed Care - HMO | Admitting: Gastroenterology

## 2015-07-17 ENCOUNTER — Other Ambulatory Visit: Payer: Self-pay | Admitting: Cardiology

## 2015-07-17 DIAGNOSIS — I10 Essential (primary) hypertension: Secondary | ICD-10-CM

## 2015-07-17 MED ORDER — LISINOPRIL 10 MG PO TABS: ORAL_TABLET | ORAL | Status: DC

## 2015-07-17 NOTE — Telephone Encounter (Signed)
Refill:  Needs refill on Lisinopril 10 mg   [facility] [place] [place]

## 2015-08-01 ENCOUNTER — Telehealth: Payer: Self-pay | Admitting: Nurse Practitioner

## 2015-08-01 DIAGNOSIS — F329 Major depressive disorder, single episode, unspecified: Secondary | ICD-10-CM

## 2015-08-01 DIAGNOSIS — F32A Depression, unspecified: Secondary | ICD-10-CM

## 2015-08-01 DIAGNOSIS — F411 Generalized anxiety disorder: Secondary | ICD-10-CM

## 2015-08-01 MED ORDER — ALPRAZOLAM 0.5 MG PO TABS
0.5000 mg | ORAL_TABLET | Freq: Three times a day (TID) | ORAL | Status: DC | PRN
Start: 2015-08-01 — End: 2016-01-26

## 2015-08-01 MED ORDER — SERTRALINE HCL 100 MG PO TABS: ORAL_TABLET | ORAL | Status: DC

## 2015-08-01 NOTE — Telephone Encounter (Signed)
Please call in xanax 0.5mg  1 po TID prn #[phone]  with 1 refills Last refill without being seen

## 2015-09-19 ENCOUNTER — Ambulatory Visit: Payer: Commercial Managed Care - HMO | Admitting: Cardiology

## 2015-09-19 NOTE — Progress Notes (Unsigned)
Patient ID: Albert [name], male   DOB: 04-18-1946, 70 y.o.   MRN: [id number]     Clinical Summary [name] is a 70 y.o.male seen today for follow up of the following medical problems.   1. Bradycardia  - resolved after stopping atenolol - checks heart rates daily at home, typically 70-80s - denies any lightheadedness or dizziness.   2. HTN - last visit stopped atenolol and started lisinopril 10 mg daily - checks at home, typically 110s/80s.   3. Hyperlipidemia - 06/2014 LDL 83 HDL 65 TG 94 TC 167 - compliant with statin Past Medical History  Diagnosis Date  . Anxiety   . Depression   . Hypertension   . Hyperlipidemia   . GERD (gastroesophageal reflux disease)   . Stroke [facility]) 2008  . Myocardial infarction (HCC) 2008  . Heart murmur      Allergies  Allergen Reactions  . Diovan [Valsartan] Nausea And Vomiting  . Hydrocodone Nausea And Vomiting    Chest discomfort.  . Penicillins     Chest pain     Current Outpatient Prescriptions  Medication Sig Dispense Refill  . ALPRAZolam (XANAX) 0.5 MG tablet Take 1 tablet (0.5 mg total) by mouth every 8 (eight) hours as needed. for anxiety 90 tablet 1  . amLODipine (NORVASC) 10 MG tablet Take 1 tablet (10 mg total) by mouth daily. 90 tablet 1  . aspirin 81 MG tablet Take 81 mg by mouth 2 (two) times daily.     . benzonatate (TESSALON PERLES) 100 MG capsule Take 1 capsule (100 mg total) by mouth 3 (three) times daily as needed for cough. 20 capsule 0  . lisinopril (PRINIVIL,ZESTRIL) 10 MG tablet TAKE 1 TABLET EVERY DAY  (DOSE INCREASE 10/6 AND DISCONTINUE ATENOLOL) 90 tablet 1  . Misc. Devices (QUAD CANE) MISC Use to balance and to prevent falls (Dx R29.6 - high fall risk) 1 each 0  . omeprazole (PRILOSEC) 20 MG capsule TAKE 1 CAPSULE TWICE DAILY  BEFORE  A  MEAL 180 capsule 1  . pravastatin (PRAVACHOL) 40 MG tablet Take 1 tablet (40 mg total) by mouth daily. 90 tablet 1  . sertraline (ZOLOFT) 100 MG tablet TAKE 1 TABLET TWICE  DAILY FOR ANXIETY AND DEPRESSION 180 tablet 1  . TYLENOL 500 MG tablet Take 1 tablet by mouth 2 (two) times daily.    . Vitamin D, Ergocalciferol, (DRISDOL) 50000 UNITS CAPS capsule Take 1 capsule (50,000 Units total) by mouth every 7 (seven) days. 12 capsule 0   No current facility-administered medications for this visit.     Past Surgical History  Procedure Laterality Date  . Appendectomy  1980     Allergies  Allergen Reactions  . Diovan [Valsartan] Nausea And Vomiting  . Hydrocodone Nausea And Vomiting    Chest discomfort.  . Penicillins     Chest pain      Family History  Problem Relation Age of Onset  . Heart disease Mother   . Hypertension Mother   . Heart disease Father   . Cancer Father   . Hypertension Brother   . Heart disease Brother      Social History [name] reports that he has never smoked. He has never used smokeless tobacco. [name] reports that he does not drink alcohol.   Review of Systems CONSTITUTIONAL: No weight loss, fever, chills, weakness or fatigue.  HEENT: Eyes: No visual loss, blurred vision, double vision or yellow sclerae.No hearing loss, sneezing, congestion, runny nose or  sore throat.  SKIN: No rash or itching.  CARDIOVASCULAR:  RESPIRATORY: No shortness of breath, cough or sputum.  GASTROINTESTINAL: No anorexia, nausea, vomiting or diarrhea. No abdominal pain or blood.  GENITOURINARY: No burning on urination, no polyuria NEUROLOGICAL: No headache, dizziness, syncope, paralysis, ataxia, numbness or tingling in the extremities. No change in bowel or bladder control.  MUSCULOSKELETAL: No muscle, back pain, joint pain or stiffness.  LYMPHATICS: No enlarged nodes. No history of splenectomy.  PSYCHIATRIC: No history of depression or anxiety.  ENDOCRINOLOGIC: No reports of sweating, cold or heat intolerance. No polyuria or polydipsia.  Marland Kitchen.   Physical Examination There were no vitals filed for this visit. There were no vitals filed  for this visit.  Gen: resting comfortably, no acute distress HEENT: no scleral icterus, pupils equal round and reactive, no palptable cervical adenopathy,  CV Resp: Clear to auscultation bilaterally GI: abdomen is soft, non-tender, non-distended, normal bowel sounds, no hepatosplenomegaly MSK: extremities are warm, no edema.  Skin: warm, no rash Neuro:  no focal deficits Psych: appropriate affect   Diagnostic Studies 09/2006 Echo  LEFT VENTRICLE: - Left ventricular size was normal. - Overall left ventricular systolic function was normal. - Left ventricular ejection fraction was estimated , range being 55 % to 60 %. - There were no left ventricular regional wall motion abnormalities. - Left ventricular wall thickness was normal.  AORTIC VALVE: - The aortic valve was trileaflet. - Aortic valve thickness was mildly increased. - The aortic valve was not calcified.  Doppler interpretation(s): - Transaortic velocity was within the normal range. - There was no significant aortic valvular regurgitation.  AORTA: - The aortic root was normal in size.  MITRAL VALVE: - Mitral valve structure was normal.  Doppler interpretation(s): - There was mild mitral valvular regurgitation.  LEFT ATRIUM: - Left atrial size was normal.  RIGHT VENTRICLE: - Right ventricular size was normal. - Right ventricular systolic function was normal. - Right ventricular wall thickness was normal.  PULMONIC VALVE: - The pulmonic valve was grossly normal.  TRICUSPID VALVE: - The tricuspid valve structure was normal.  Doppler interpretation(s): - There was no significant tricuspid valvular regurgitation.  RIGHT ATRIUM: - Right atrial size was normal.  PERICARDIUM: - There was no pericardial effusion.  ---------------------------------------------------------------  SUMMARY - Overall left ventricular systolic function was normal. Left ventricular ejection fraction was estimated , range  being 55 % to 60 %. There were no left ventricular regional wall motion abnormalities. - Aortic valve thickness was mildly increased. - There was mild mitral valvular regurgitation.    Assessment and Plan  1. Bradycardia  - resolved since stopping atenolol - continue to follow clinically   2. HTN - at goal, continue current meds  3. Hyperlipidemia - at goal, continue current statin      Antoine PocheJonathan F. Branch, M.D.

## 2015-10-08 ENCOUNTER — Ambulatory Visit (INDEPENDENT_AMBULATORY_CARE_PROVIDER_SITE_OTHER): Payer: Medicare HMO | Admitting: Cardiology

## 2015-10-08 ENCOUNTER — Encounter: Payer: Self-pay | Admitting: Cardiology

## 2015-10-08 VITALS — BP 122/80 | HR 71 | Ht 71.0 in | Wt 180.2 lb

## 2015-10-08 DIAGNOSIS — E785 Hyperlipidemia, unspecified: Secondary | ICD-10-CM

## 2015-10-08 DIAGNOSIS — I10 Essential (primary) hypertension: Secondary | ICD-10-CM

## 2015-10-08 DIAGNOSIS — I2510 Atherosclerotic heart disease of native coronary artery without angina pectoris: Secondary | ICD-10-CM

## 2015-10-08 DIAGNOSIS — R001 Bradycardia, unspecified: Secondary | ICD-10-CM

## 2015-10-08 NOTE — Patient Instructions (Signed)

## 2015-10-08 NOTE — Progress Notes (Signed)
Patient ID: Albert [name], male   DOB: 04/30/1945, 70 y.o.   MRN: [id number]     Clinical Summary Mr. Kutzer is a 70 y.o.male seen today for follow up of the following medical problems.   1. Bradycardia  - resolved after stopping atenolol - checks heart rates daily at home, typically 60-80s  2. HTN - last visit stopped atenolol and started lisinopril 10 mg daily - checks at home, typically 120-130s/70-80s.   3. Hyperlipidemia - 03/2015: TC 158 TG 113 HDL 65 LDL 70 - compliant with statin Past Medical History  Diagnosis Date  . Anxiety   . Depression   . Hypertension   . Hyperlipidemia   . GERD (gastroesophageal reflux disease)   . Stroke [facility]) 2008  . Myocardial infarction (HCC) 2008  . Heart murmur      Allergies  Allergen Reactions  . Diovan [Valsartan] Nausea And Vomiting  . Hydrocodone Nausea And Vomiting    Chest discomfort.  . Penicillins     Chest pain     Current Outpatient Prescriptions  Medication Sig Dispense Refill  . ALPRAZolam (XANAX) 0.5 MG tablet Take 1 tablet (0.5 mg total) by mouth every 8 (eight) hours as needed. for anxiety 90 tablet 1  . amLODipine (NORVASC) 10 MG tablet Take 1 tablet (10 mg total) by mouth daily. 90 tablet 1  . aspirin 81 MG tablet Take 81 mg by mouth 2 (two) times daily.     . benzonatate (TESSALON PERLES) 100 MG capsule Take 1 capsule (100 mg total) by mouth 3 (three) times daily as needed for cough. 20 capsule 0  . lisinopril (PRINIVIL,ZESTRIL) 10 MG tablet TAKE 1 TABLET EVERY DAY  (DOSE INCREASE 10/6 AND DISCONTINUE ATENOLOL) 90 tablet 1  . Misc. Devices (QUAD CANE) MISC Use to balance and to prevent falls (Dx R29.6 - high fall risk) 1 each 0  . omeprazole (PRILOSEC) 20 MG capsule TAKE 1 CAPSULE TWICE DAILY  BEFORE  A  MEAL 180 capsule 1  . pravastatin (PRAVACHOL) 40 MG tablet Take 1 tablet (40 mg total) by mouth daily. 90 tablet 1  . sertraline (ZOLOFT) 100 MG tablet TAKE 1 TABLET TWICE DAILY FOR ANXIETY AND DEPRESSION 180  tablet 1  . TYLENOL 500 MG tablet Take 1 tablet by mouth 2 (two) times daily.    . Vitamin D, Ergocalciferol, (DRISDOL) 50000 UNITS CAPS capsule Take 1 capsule (50,000 Units total) by mouth every 7 (seven) days. 12 capsule 0   No current facility-administered medications for this visit.     Past Surgical History  Procedure Laterality Date  . Appendectomy  1980     Allergies  Allergen Reactions  . Diovan [Valsartan] Nausea And Vomiting  . Hydrocodone Nausea And Vomiting    Chest discomfort.  . Penicillins     Chest pain      Family History  Problem Relation Age of Onset  . Heart disease Mother   . Hypertension Mother   . Heart disease Father   . Cancer Father   . Hypertension Brother   . Heart disease Brother      Social History Mr. Bona reports that he has never smoked. He has never used smokeless tobacco. Mr. Higham reports that he does not drink alcohol.   Review of Systems CONSTITUTIONAL: No weight loss, fever, chills, weakness or fatigue.  HEENT: Eyes: No visual loss, blurred vision, double vision or yellow sclerae.No hearing loss, sneezing, congestion, runny nose or sore throat.  SKIN: No rash or  itching.  CARDIOVASCULAR: per HPI RESPIRATORY: No shortness of breath, cough or sputum.  GASTROINTESTINAL: No anorexia, nausea, vomiting or diarrhea. No abdominal pain or blood.  GENITOURINARY: No burning on urination, no polyuria NEUROLOGICAL: No headache, dizziness, syncope, paralysis, ataxia, numbness or tingling in the extremities. No change in bowel or bladder control.  MUSCULOSKELETAL: No muscle, back pain, joint pain or stiffness.  LYMPHATICS: No enlarged nodes. No history of splenectomy.  PSYCHIATRIC: No history of depression or anxiety.  ENDOCRINOLOGIC: No reports of sweating, cold or heat intolerance. No polyuria or polydipsia.  Marland Kitchen.   Physical Examination Filed Vitals:   10/08/15 1430  BP: 122/80  Pulse: 71   Filed Vitals:   10/08/15 1430  Height:  5\' 11"  (1.803 m)  Weight: 180 lb 3.2 oz (81.738 kg)    Gen: resting comfortably, no acute distress HEENT: no scleral icterus, pupils equal round and reactive, no palptable cervical adenopathy,  CV: RRR, no m/r/g, no jvd Resp: Clear to auscultation bilaterally GI: abdomen is soft, non-tender, non-distended, normal bowel sounds, no hepatosplenomegaly MSK: extremities are warm, no edema.  Skin: warm, no rash Neuro:  no focal deficits Psych: appropriate affect   Diagnostic Studies 09/2006 Echo  LEFT VENTRICLE: - Left ventricular size was normal. - Overall left ventricular systolic function was normal. - Left ventricular ejection fraction was estimated , range being 55 % to 60 %. - There were no left ventricular regional wall motion abnormalities. - Left ventricular wall thickness was normal.  AORTIC VALVE: - The aortic valve was trileaflet. - Aortic valve thickness was mildly increased. - The aortic valve was not calcified.  Doppler interpretation(s): - Transaortic velocity was within the normal range. - There was no significant aortic valvular regurgitation.  AORTA: - The aortic root was normal in size.  MITRAL VALVE: - Mitral valve structure was normal.  Doppler interpretation(s): - There was mild mitral valvular regurgitation.  LEFT ATRIUM: - Left atrial size was normal.  RIGHT VENTRICLE: - Right ventricular size was normal. - Right ventricular systolic function was normal. - Right ventricular wall thickness was normal.  PULMONIC VALVE: - The pulmonic valve was grossly normal.  TRICUSPID VALVE: - The tricuspid valve structure was normal.  Doppler interpretation(s): - There was no significant tricuspid valvular regurgitation.  RIGHT ATRIUM: - Right atrial size was normal.  PERICARDIUM: - There was no pericardial effusion.  ---------------------------------------------------------------  SUMMARY - Overall left ventricular systolic function was  normal. Left ventricular ejection fraction was estimated , range being 55 % to 60 %. There were no left ventricular regional wall motion abnormalities. - Aortic valve thickness was mildly increased. - There was mild mitral valvular regurgitation.    Assessment and Plan   1. Bradycardia  - resolved since stopping atenolol, home heart rates remain within normal limits. EKG in clinic shows NSR - continue to monitor  2. HTN - at goal, we will continue current meds  3. Hyperlipidemia - at goal, he will continue current statin   F/u 1 year  Antoine PocheJonathan F. , M.D.

## 2015-12-11 ENCOUNTER — Other Ambulatory Visit: Payer: Self-pay | Admitting: Nurse Practitioner

## 2015-12-11 DIAGNOSIS — K219 Gastro-esophageal reflux disease without esophagitis: Secondary | ICD-10-CM

## 2015-12-12 NOTE — Telephone Encounter (Signed)
Last refill without being seen 

## 2015-12-23 ENCOUNTER — Telehealth: Payer: Self-pay | Admitting: Internal Medicine

## 2015-12-23 NOTE — Telephone Encounter (Signed)
[facility] called for confirmation on zoloft RX. DC zoloft 50mg  and zoloft 100mg  to be prescribed.

## 2016-01-06 ENCOUNTER — Telehealth (HOSPITAL_COMMUNITY): Payer: Self-pay | Admitting: *Deleted

## 2016-01-06 ENCOUNTER — Ambulatory Visit (INDEPENDENT_AMBULATORY_CARE_PROVIDER_SITE_OTHER): Payer: Self-pay | Admitting: Otolaryngology

## 2016-01-06 NOTE — Telephone Encounter (Signed)
Left voice message regarding an appointment. 

## 2016-01-26 ENCOUNTER — Other Ambulatory Visit: Payer: Self-pay | Admitting: Nurse Practitioner

## 2016-01-26 DIAGNOSIS — F411 Generalized anxiety disorder: Secondary | ICD-10-CM

## 2016-01-27 ENCOUNTER — Ambulatory Visit: Payer: Self-pay | Admitting: Pharmacist

## 2016-01-27 NOTE — Telephone Encounter (Signed)
Refill called to [facility] 

## 2016-01-27 NOTE — Telephone Encounter (Signed)
Please call in xanax with 0 refills Last refill without being seen  

## 2016-02-10 ENCOUNTER — Encounter: Payer: Self-pay | Admitting: Nurse Practitioner

## 2016-02-19 NOTE — Progress Notes (Signed)
Psychiatric Initial Adult Assessment   Patient Identification: Albert [name] MRN:  [id number] Date of Evaluation:  02/20/2016 Referral Source: Eduard ClosAshish Shaw Chief Complaint:   Chief Complaint    Anxiety; New Evaluation    "I have nerves" Visit Diagnosis:    ICD-9-CM ICD-10-CM   1. Generalized anxiety disorder 300.02 F41.1     History of Present Illness:   Albert MatterJames L Cowley is a 70 year old male with anxiety, depression, hypertension, hyperlipidemia, Stroke, CAD who is referred for anxiety.  He states that he has been doing well, but was told that he needs to come here to continue his medication. He has panic attack once in a while and has worsening tremors when he feels anxious. He takes Xanax twice a day. He denies insomnia. He reports good energy, stating that he likes to make people laugh. He denies SI, HI, AH, VH. He denies decreased need for sleep. He denies history of trauma. He denies alcohol use or drug use. He states that he has had hand tremors for years and denies any worsening in his symptoms.   Associated Signs/Symptoms: Depression Symptoms:  anxiety, panic attacks, (Hypo) Manic Symptoms:  denies Anxiety Symptoms:  Panic Symptoms, Psychotic Symptoms:  denies PTSD Symptoms: Negative  Past Psychiatric History:  Outpatient: denies Psychiatry admission: denies Previous suicide attempt:  Past trials of medication: sertraline, Xanax,   Previous Psychotropic Medications: No   Substance Abuse History in the last 12 months:  No.  Consequences of Substance Abuse: NA  Past Medical History:  Past Medical History:  Diagnosis Date  . Anxiety   . Depression   . GERD (gastroesophageal reflux disease)   . Heart murmur   . Hyperlipidemia   . Hypertension   . Myocardial infarction 2008  . Stroke [facility](HCC) 2008    Past Surgical History:  Procedure Laterality Date  . APPENDECTOMY  1980    Family Psychiatric History: denies mental health, suicide history   Family History:   Family History  Problem Relation Age of Onset  . Heart disease Mother   . Hypertension Mother   . Heart disease Father   . Cancer Father   . Hypertension Brother   . Heart disease Brother     Social History:   Social History   Social History  . Marital status: Single    Spouse name: N/A  . Number of children: N/A  . Years of education: N/A   Social History Main Topics  . Smoking status: Never Smoker  . Smokeless tobacco: Never Used  . Alcohol use No  . Drug use: No  . Sexual activity: No   Other Topics Concern  . None   Social History Narrative  . 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 Diovan [Valsartan] Nausea And Vomiting  . Hydrocodone Nausea And Vomiting    Chest discomfort.  . Penicillins     Chest pain    Metabolic Disorder Labs: Lab Results  Component Value Date   HGBA1C  10/14/2006    6.1 (NOTE)   The ADA recommends the following therapeutic goals for glycemic   control related to Hgb A1C measurement:   Goal of Therapy:   < 7.0% Hgb A1C   Action Suggested:  > 8.0% Hgb A1C   Ref:  Diabetes Care, 22, Suppl.  1, 1999   Lab Results  Component Value Date   PROLACTIN  11/11/2006    6.2 (NOTE)     Reference Ranges:                 Male:                       2.1 -  17.1 ng/ml                 Male:   Pregnant          9.7 - 208.5 ng/mL                           Non Pregnant      2.8 -  29.2 ng/mL                           Post  Menopausal   1.8 -  20.3 ng/mL                     Lab Results  Component Value Date   CHOL 158 04/23/2015   TRIG 113 04/23/2015   HDL 65 04/23/2015   CHOLHDL 2.4 04/23/2015   VLDL 17 11/10/2006   LDLCALC 70 04/23/2015   LDLCALC 68 11/23/2014     Current Medications: Current Outpatient Prescriptions  Medication Sig Dispense Refill  .  amLODipine (NORVASC) 10 MG tablet Take 1 tablet (10 mg total) by mouth daily. 90 tablet 1  . aspirin 81 MG tablet Take 81 mg by mouth 2 (two) times daily.     Marland Kitchen lisinopril (PRINIVIL,ZESTRIL) 10 MG tablet TAKE 1 TABLET EVERY DAY  (DOSE INCREASE 10/6 AND DISCONTINUE ATENOLOL) 90 tablet 1  . Misc. Devices (QUAD CANE) MISC Use to balance and to prevent falls (Dx R29.6 - high fall risk) 1 each 0  . Multiple Vitamins-Minerals (MULTIVITAMIN ADULT PO) Take 1 tablet by mouth daily.    Marland Kitchen omeprazole (PRILOSEC) 20 MG capsule TAKE 1 CAPSULE TWICE DAILY BEFORE MEALS 180 capsule 0  . pravastatin (PRAVACHOL) 40 MG tablet Take 1 tablet (40 mg total) by mouth daily. 90 tablet 1  . sertraline (ZOLOFT) 100 MG tablet Take 1 tablet (100 mg total) by mouth daily. 180 tablet 0  . TYLENOL 500 MG tablet Take 1 tablet by mouth 2 (two) times daily.    Marland Kitchen LORazepam (ATIVAN) 0.5 MG tablet Take 1 tablet (0.5 mg total) by mouth 2 (two) times daily. 60 tablet 0   No current facility-administered medications for this visit.     Neurologic: Headache: No Seizure: No Paresthesias:No  Musculoskeletal: Strength & Muscle Tone: within normal limits Gait & Station: normal Patient leans: N/A  Psychiatric Specialty Exam: Review of Systems  Neurological: Positive for tremors.  Psychiatric/Behavioral: Negative for depression, hallucinations, substance abuse and suicidal ideas. The patient is nervous/anxious. The patient does not have insomnia.     Blood pressure (!) 131/91, pulse 62, height 5\' 11"  (1.803 m), weight 185 lb 3.2 oz (84 kg).Body mass index is 25.83 kg/m.  General Appearance: Fairly Groomed  Eye Contact:  Good  Speech:  Clear and Coherent  Volume:  Normal  Mood:  "good"  Affect:  Appropriate, euthymic, smiles at times  Thought Process:  Coherent and Goal Directed  Orientation:  Full (Time, Place, and Person)  Thought Content:  Logical Perceptions: denies AH/VH  Suicidal Thoughts:  No  Homicidal Thoughts:  No   Memory:  Immediate;   Good Recent;   Good Remote;   Good  Judgement:  Good  Insight:  Fair  Psychomotor Activity:  Normal  Concentration:  Concentration: Good and Attention Span: Good  Recall:  Good  Fund of Knowledge:Good  Language: Good  Akathisia:  NA  Handed:  Right  AIMS (if indicated):  N/A  Assets:  Communication Skills Desire for Improvement  ADL's:  Intact  Cognition: WNL  Sleep:  good   Assessment Albert [name] is a 70 year old male with anxiety, hypertension, hyperlipidemia, Stroke, CAD who is referred for anxiety.  # GAD Patient reports good response to current medication and denies any significant mood symptoms except occasional anxiety with worsening tremors. Will continue sertraline. 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  Plan 1. Continue sertraline 200 mg daily  2. Discontinue Xanax 3. Start Ativan 0.5 mg twice a day as needed for anxiety, dispense 60 tabs with no refill 4. Return to clinic in one month  The patient demonstrates the following risk factors for suicide: Chronic risk factors for suicide include: psychiatric disorder of depression, anxiety. Acute risk factors for suicide include: unemployment and social withdrawal/isolation. Protective factors for this patient include: coping skills and hope for the future. Considering these factors, the overall suicide risk at this point appears to be low. Patient is appropriate for outpatient follow up.  Albert [name] is a 70 year old male with anxiety, hypertension, hyperlipidemia, Stroke, CAD who is referred for anxiety.  Treatment Plan Summary: Plan as above   Neysa Hotter, MD 10/26/201711:14 AM

## 2016-02-20 ENCOUNTER — Ambulatory Visit (INDEPENDENT_AMBULATORY_CARE_PROVIDER_SITE_OTHER): Payer: Medicare HMO | Admitting: Psychiatry

## 2016-02-20 ENCOUNTER — Encounter (HOSPITAL_COMMUNITY): Payer: Self-pay | Admitting: Psychiatry

## 2016-02-20 ENCOUNTER — Encounter (INDEPENDENT_AMBULATORY_CARE_PROVIDER_SITE_OTHER): Payer: Self-pay

## 2016-02-20 VITALS — BP 131/91 | HR 62 | Ht 71.0 in | Wt 185.2 lb

## 2016-02-20 DIAGNOSIS — Z7982 Long term (current) use of aspirin: Secondary | ICD-10-CM

## 2016-02-20 DIAGNOSIS — Z880 Allergy status to penicillin: Secondary | ICD-10-CM

## 2016-02-20 DIAGNOSIS — Z8249 Family history of ischemic heart disease and other diseases of the circulatory system: Secondary | ICD-10-CM | POA: Diagnosis not present

## 2016-02-20 DIAGNOSIS — F411 Generalized anxiety disorder: Secondary | ICD-10-CM | POA: Diagnosis not present

## 2016-02-20 DIAGNOSIS — Z79899 Other long term (current) drug therapy: Secondary | ICD-10-CM

## 2016-02-20 DIAGNOSIS — Z808 Family history of malignant neoplasm of other organs or systems: Secondary | ICD-10-CM

## 2016-02-20 DIAGNOSIS — Z888 Allergy status to other drugs, medicaments and biological substances status: Secondary | ICD-10-CM

## 2016-02-20 MED ORDER — LORAZEPAM 0.5 MG PO TABS: 0.5000 mg | ORAL_TABLET | Freq: Two times a day (BID) | ORAL | 0 refills | Status: DC

## 2016-02-20 MED ORDER — SERTRALINE HCL 100 MG PO TABS: 100.0000 mg | ORAL_TABLET | Freq: Every day | ORAL | 0 refills | Status: DC

## 2016-02-20 NOTE — Patient Instructions (Signed)
1. Continue sertraline 200 mg daily  2. Discontinue Xanax 3. Start Ativan 0.5 mg twice a day as needed for anxiety 4. Return to clinic in one month

## 2016-03-16 NOTE — Progress Notes (Deleted)
BH MD/PA/NP OP Progress Note  03/16/2016 11:05 AM Bing MatterJames L Kawasaki  MRN:  [id number]  Chief Complaint:  Subjective:  *** HPI: *** Visit Diagnosis: No diagnosis found.  Past Psychiatric History:  Outpatient: denies Psychiatry admission: denies Previous suicide attempt:  Past trials of medication: sertraline, Xanax,   Past Medical History:  Past Medical History:  Diagnosis Date  . Anxiety   . Depression   . GERD (gastroesophageal reflux disease)   . Heart murmur   . Hyperlipidemia   . Hypertension   . Myocardial infarction 2008  . Stroke [facility]) 2008    Past Surgical History:  Procedure Laterality Date  . APPENDECTOMY  1980    Family Psychiatric History: denies mental health, suicide history   Family History:  Family History  Problem Relation Age of Onset  . Heart disease Mother   . Hypertension Mother   . Heart disease Father   . Cancer Father   . Hypertension Brother   . Heart disease Brother     Social History:  Social History   Social History  . Marital status: Single    Spouse name: N/A  . Number of children: N/A  . Years of education: N/A   Social History Main Topics  . Smoking status: Never Smoker  . Smokeless tobacco: Never Used  . Alcohol use No  . Drug use: No  . Sexual activity: No   Other Topics Concern  . Not on file   Social History Narrative  . No narrative on file   Military [phone], [employer], no combat, medical discharge due to anxiety Lives by himself,  On SSI, used to work as a Naval architecttruck driver  Allergies:  Allergies  Allergen Reactions  . Diovan [Valsartan] Nausea And Vomiting  . Hydrocodone Nausea And Vomiting    Chest discomfort.  . Penicillins     Chest pain    Metabolic Disorder Labs: Lab Results  Component Value Date   HGBA1C  10/14/2006    6.1 (NOTE)   The ADA recommends the following therapeutic goals for glycemic   control related to Hgb A1C measurement:   Goal of Therapy:   < 7.0% Hgb A1C   Action  Suggested:  > 8.0% Hgb A1C   Ref:  Diabetes Care, 22, Suppl. 1, 1999   Lab Results  Component Value Date   PROLACTIN  11/11/2006    6.2 (NOTE)     Reference Ranges:                 Male:                       2.1 -  17.1 ng/ml                 Male:   Pregnant          9.7 - 208.5 ng/mL                           Non Pregnant      2.8 -  29.2 ng/mL                           Post  Menopausal   1.8 -  20.3 ng/mL                     Lab Results  Component Value Date   CHOL 158 04/23/2015  TRIG 113 04/23/2015   HDL 65 04/23/2015   CHOLHDL 2.4 04/23/2015   VLDL 17 11/10/2006   LDLCALC 70 04/23/2015   LDLCALC 68 11/23/2014     Current Medications: Current Outpatient Prescriptions  Medication Sig Dispense Refill  . amLODipine (NORVASC) 10 MG tablet Take 1 tablet (10 mg total) by mouth daily. 90 tablet 1  . aspirin 81 MG tablet Take 81 mg by mouth 2 (two) times daily.     Marland Kitchen lisinopril (PRINIVIL,ZESTRIL) 10 MG tablet TAKE 1 TABLET EVERY DAY  (DOSE INCREASE 10/6 AND DISCONTINUE ATENOLOL) 90 tablet 1  . LORazepam (ATIVAN) 0.5 MG tablet Take 1 tablet (0.5 mg total) by mouth 2 (two) times daily. 60 tablet 0  . Misc. Devices (QUAD CANE) MISC Use to balance and to prevent falls (Dx R29.6 - high fall risk) 1 each 0  . Multiple Vitamins-Minerals (MULTIVITAMIN ADULT PO) Take 1 tablet by mouth daily.    Marland Kitchen omeprazole (PRILOSEC) 20 MG capsule TAKE 1 CAPSULE TWICE DAILY BEFORE MEALS 180 capsule 0  . pravastatin (PRAVACHOL) 40 MG tablet Take 1 tablet (40 mg total) by mouth daily. 90 tablet 1  . sertraline (ZOLOFT) 100 MG tablet Take 1 tablet (100 mg total) by mouth daily. 180 tablet 0  . TYLENOL 500 MG tablet Take 1 tablet by mouth 2 (two) times daily.     No current facility-administered medications for this visit.     Neurologic: Headache: {BHH YES OR NO:22294} Seizure: {BHH YES OR NO:22294} Paresthesias: {BHH YES OR NO:22294}  Musculoskeletal: Strength & Muscle Tone: {desc; muscle  tone:32375} Gait & Station: {PE GAIT ED XLKG:40102} Patient leans: {Patient Leans:21022755}  Psychiatric Specialty Exam: ROS  There were no vitals taken for this visit.There is no height or weight on file to calculate BMI.  General Appearance: {Appearance:22683}  Eye Contact:  {BHH EYE CONTACT:22684}  Speech:  {Speech:22685}  Volume:  {Volume (PAA):22686}  Mood:  {BHH MOOD:22306}  Affect:  {Affect (PAA):22687}  Thought Process:  {Thought Process (PAA):22688}  Orientation:  {BHH ORIENTATION (PAA):22689}  Thought Content: {Thought Content:22690}   Suicidal Thoughts:  {ST/HT (PAA):22692}  Homicidal Thoughts:  {ST/HT (PAA):22692}  Memory:  {BHH MEMORY:22881}  Judgement:  {Judgement (PAA):22694}  Insight:  {Insight (PAA):22695}  Psychomotor Activity:  {Psychomotor (PAA):22696}  Concentration:  {Concentration:21399}  Recall:  {BHH GOOD/FAIR/POOR:22877}  Fund of Knowledge: {BHH GOOD/FAIR/POOR:22877}  Language: {BHH GOOD/FAIR/POOR:22877}  Akathisia:  {BHH YES OR NO:22294}  Handed:  {Handed:22697}  AIMS (if indicated):  ***  Assets:  {Assets (PAA):22698}  ADL's:  {BHH VOZ'D:66440}  Cognition: {chl bhh cognition:304700322}  Sleep:  ***   Assessment KOBY PICKUP is a 70 year old male with anxiety, hypertension, hyperlipidemia, Stroke, CAD who is referred for anxiety.  # GAD Patient reports good response to current medication and denies any significant mood symptoms except occasional anxiety with worsening tremors. Will continue sertraline. 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  Plan 1. Continue sertraline 200 mg daily  2. Discontinue Xanax 3. Start Ativan 0.5 mg twice a day as needed for anxiety, dispense 60 tabs with no refill 4. Return to clinic in one month  The patient demonstrates the following risk factors for suicide: Chronic risk factors for suicide include: psychiatric disorder of depression, anxiety. Acute risk factors for  suicide include: unemployment and social withdrawal/isolation. Protective factors for this patient include: coping skills and hope for the future. Considering these factors, the overall suicide risk at this point appears to be low.  Patient is appropriate for outpatient follow up.  Bing MatterJames L Newburg is a 70 year old male with anxiety, hypertension, hyperlipidemia, Stroke, CAD who is referred for anxiety.  Treatment Plan Summary:{[facility] [place] ZOXW:[id number]}[phone]}   Neysa Hottereina , MD 03/16/2016, 11:05 AM

## 2016-03-17 ENCOUNTER — Ambulatory Visit (HOSPITAL_COMMUNITY): Payer: Self-pay | Admitting: Psychiatry

## 2016-03-30 NOTE — Progress Notes (Signed)
BH MD/PA/NP OP Progress Note  03/31/2016 1:29 PM Albert [name]  MRN:  [id number]  Chief Complaint:  Chief Complaint    Anxiety; Follow-up     Subjective:   "I am doing good."  HPI:  Patient presents for follow up appointment. He states that he is doing good since the last visit. He was able to be off Xanax and takes Ativan twice a day. He takes Ativan in the morning for his hand tremors and takes another at night for insomnia. He reports insomnia with night time awakening as he needs to go to the bathroom. He denies having panic attacks for a couple of years. He enjoys walking a dog and watching a TV. He denies SI/HI.   Visit Diagnosis:    ICD-9-CM ICD-10-CM   1. Generalized anxiety disorder 300.02 F41.1     Past Psychiatric History:  Outpatient: denies Psychiatry admission: denies Previous suicide attempt:  Past trials of medication: sertraline, Xanax,   Past Medical History:  Past Medical History:  Diagnosis Date  . Anxiety   . Depression   . GERD (gastroesophageal reflux disease)   . Heart murmur   . Hyperlipidemia   . Hypertension   . Myocardial infarction 2008  . Stroke [facility]) 2008    Past Surgical History:  Procedure Laterality Date  . APPENDECTOMY  1980    Family Psychiatric History: denies mental health, suicide history   Family History:  Family History  Problem Relation Age of Onset  . Heart disease Mother   . Hypertension Mother   . Heart disease Father   . Cancer Father   . Hypertension Brother   . Heart disease Brother     Social History:  Social History   Social History  . Marital status: Single    Spouse name: N/A  . Number of children: N/A  . Years of education: N/A   Social History Main Topics  . Smoking status: Never Smoker  . Smokeless tobacco: Never Used  . Alcohol use No  . Drug use: No  . Sexual activity: No   Other Topics Concern  . None   Social History Narrative  . None   Military [phone], [employer], no combat,  medical discharge due to anxiety Lives by himself,  On SSI, used to work as a Naval architect  Allergies:  Allergies  Allergen Reactions  . Diovan [Valsartan] Nausea And Vomiting  . Hydrocodone Nausea And Vomiting    Chest discomfort.  . Penicillins     Chest pain    Metabolic Disorder Labs: Lab Results  Component Value Date   HGBA1C  10/14/2006    6.1 (NOTE)   The ADA recommends the following therapeutic goals for glycemic   control related to Hgb A1C measurement:   Goal of Therapy:   < 7.0% Hgb A1C   Action Suggested:  > 8.0% Hgb A1C   Ref:  Diabetes Care, 22, Suppl. 1, 1999   Lab Results  Component Value Date   PROLACTIN  11/11/2006    6.2 (NOTE)     Reference Ranges:                 Male:                       2.1 -  17.1 ng/ml                 Male:   Pregnant  9.7 - 208.5 ng/mL                           Non Pregnant      2.8 -  29.2 ng/mL                           Post  Menopausal   1.8 -  20.3 ng/mL                     Lab Results  Component Value Date   CHOL 158 04/23/2015   TRIG 113 04/23/2015   HDL 65 04/23/2015   CHOLHDL 2.4 04/23/2015   VLDL 17 11/10/2006   LDLCALC 70 04/23/2015   LDLCALC 68 11/23/2014     Current Medications: Current Outpatient Prescriptions  Medication Sig Dispense Refill  . amLODipine (NORVASC) 10 MG tablet Take 1 tablet (10 mg total) by mouth daily. 90 tablet 1  . aspirin 81 MG tablet Take 81 mg by mouth 2 (two) times daily.     Marland Kitchen. lisinopril (PRINIVIL,ZESTRIL) 10 MG tablet TAKE 1 TABLET EVERY DAY  (DOSE INCREASE 10/6 AND DISCONTINUE ATENOLOL) 90 tablet 1  . LORazepam (ATIVAN) 0.5 MG tablet Take 1 tablet (0.5 mg total) by mouth 2 (two) times daily. 180 tablet 0  . Misc. Devices (QUAD CANE) MISC Use to balance and to prevent falls (Dx R29.6 - high fall risk) 1 each 0  . Multiple Vitamins-Minerals (MULTIVITAMIN ADULT PO) Take 1 tablet by mouth daily.    Marland Kitchen. omeprazole (PRILOSEC) 20 MG capsule TAKE 1 CAPSULE TWICE DAILY BEFORE MEALS 180  capsule 0  . pravastatin (PRAVACHOL) 40 MG tablet Take 1 tablet (40 mg total) by mouth daily. 90 tablet 1  . sertraline (ZOLOFT) 100 MG tablet Take 2 tablets (200 mg total) by mouth daily. 180 tablet 0  . TYLENOL 500 MG tablet Take 1 tablet by mouth 2 (two) times daily.     No current facility-administered medications for this visit.     Neurologic: Headache: No Seizure: No Paresthesias: No  Musculoskeletal: Strength & Muscle Tone: within normal limits Gait & Station: normal Patient leans: N/A  Psychiatric Specialty Exam: Review of Systems  Neurological: Positive for tremors.  Psychiatric/Behavioral: Negative for depression, hallucinations, substance abuse and suicidal ideas. The patient is nervous/anxious and has insomnia.   All other systems reviewed and are negative.   Blood pressure 132/83, pulse 77, height 5\' 11"  (1.803 m), weight 181 lb (82.1 kg).Body mass index is 25.24 kg/m.  General Appearance: Casual  Eye Contact:  Good  Speech:  Clear and Coherent  Volume:  Normal  Mood:  "good"  Affect:  Appropriate and Congruent  Thought Process:  Coherent and Goal Directed  Orientation:  Full (Time, Place, and Person)  Thought Content: Logical Perceptions: denies AH/VH  Suicidal Thoughts:  No  Homicidal Thoughts:  No  Memory:  Immediate;   Good Recent;   Good Remote;   Good  Judgement:  Good  Insight:  Good  Psychomotor Activity:  Normal  Concentration:  Concentration: Good and Attention Span: Good  Recall:  Good  Fund of Knowledge: Good  Language: Good  Akathisia:  No  Handed:  Right  AIMS (if indicated):  N/A  Assets:  Communication Skills Desire for Improvement  ADL's:  Intact  Cognition: WNL  Sleep:  poor   Assessment Bing MatterJames L Cannata is a 70 year old male with anxiety,  hypertension, hyperlipidemia, Stroke, CAD who is referred for anxiety.  # GAD Patient reports good response to current medication and denies any significant mood symptoms except occasional  anxiety with worsening tremors. Will continue sertraline. Patient is advised to decrease Ativan given risk of falls and physical dependence.   # Insomnia Discussed sleep hygiene. Patient is encouraged to continue daily exercise.   Plan 1. Continue sertraline 200 mg daily  2. Decrease Ativan 0.25-0.5 mg twice a day as needed for anxiety- order for 90 days, no refill 3. Return to clinic in February  The patient demonstrates the following risk factors for suicide: Chronic risk factors for suicide include: psychiatric disorder of depression, anxiety. Acute risk factors for suicide include: unemployment and social withdrawal/isolation. Protective factors for this patient include: coping skills and hope for the future. Considering these factors, the overall suicide risk at this point appears to be low. Patient is appropriate for outpatient follow up.  Treatment Plan Summary:Plan as above   Neysa Hottereina , MD 03/31/2016, 1:29 PM

## 2016-03-31 ENCOUNTER — Encounter (HOSPITAL_COMMUNITY): Payer: Self-pay | Admitting: Psychiatry

## 2016-03-31 ENCOUNTER — Ambulatory Visit (INDEPENDENT_AMBULATORY_CARE_PROVIDER_SITE_OTHER): Payer: Medicare HMO | Admitting: Psychiatry

## 2016-03-31 VITALS — BP 132/83 | HR 77 | Ht 71.0 in | Wt 181.0 lb

## 2016-03-31 DIAGNOSIS — Z9889 Other specified postprocedural states: Secondary | ICD-10-CM | POA: Diagnosis not present

## 2016-03-31 DIAGNOSIS — F411 Generalized anxiety disorder: Secondary | ICD-10-CM

## 2016-03-31 DIAGNOSIS — Z79899 Other long term (current) drug therapy: Secondary | ICD-10-CM

## 2016-03-31 DIAGNOSIS — Z8249 Family history of ischemic heart disease and other diseases of the circulatory system: Secondary | ICD-10-CM | POA: Diagnosis not present

## 2016-03-31 DIAGNOSIS — Z888 Allergy status to other drugs, medicaments and biological substances status: Secondary | ICD-10-CM

## 2016-03-31 DIAGNOSIS — Z880 Allergy status to penicillin: Secondary | ICD-10-CM

## 2016-03-31 DIAGNOSIS — Z808 Family history of malignant neoplasm of other organs or systems: Secondary | ICD-10-CM

## 2016-03-31 MED ORDER — LORAZEPAM 0.5 MG PO TABS: 0.5000 mg | ORAL_TABLET | Freq: Two times a day (BID) | ORAL | 0 refills | Status: DC

## 2016-03-31 MED ORDER — SERTRALINE HCL 100 MG PO TABS: 200.0000 mg | ORAL_TABLET | Freq: Every day | ORAL | 0 refills | Status: DC

## 2016-03-31 NOTE — Patient Instructions (Addendum)
1. Continue sertraline 200 mg daily  2. Decrease Ativan 0.25-0.5 mg twice a day as needed for anxiety 3. Return to clinic in February

## 2016-04-03 ENCOUNTER — Other Ambulatory Visit: Payer: Self-pay | Admitting: Nurse Practitioner

## 2016-04-03 DIAGNOSIS — K219 Gastro-esophageal reflux disease without esophagitis: Secondary | ICD-10-CM

## 2016-05-25 ENCOUNTER — Telehealth (HOSPITAL_COMMUNITY): Payer: Self-pay | Admitting: *Deleted

## 2016-05-25 NOTE — Telephone Encounter (Signed)
left vocie message regarding appointment on 06/02/16. provider not available.

## 2016-05-29 NOTE — Progress Notes (Deleted)
BH MD/PA/NP OP Progress Note  05/29/2016 10:42 AM Albert [name]  MRN:  [id number]  Chief Complaint:   Subjective:   "I am doing good."  HPI:  Patient presents for follow up appointment. He states that he is doing good since the last visit. He was able to be off Xanax and takes Ativan twice a day. He takes Ativan in the morning for his hand tremors and takes another at night for insomnia. He reports insomnia with night time awakening as he needs to go to the bathroom. He denies having panic attacks for a couple of years. He enjoys walking a dog and watching a TV. He denies SI/HI.   Visit Diagnosis:  No diagnosis found.  Past Psychiatric History:  Outpatient: denies Psychiatry admission: denies Previous suicide attempt:  Past trials of medication: sertraline, Xanax,   Past Medical History:  Past Medical History:  Diagnosis Date  . Anxiety   . Depression   . GERD (gastroesophageal reflux disease)   . Heart murmur   . Hyperlipidemia   . Hypertension   . Myocardial infarction 2008  . Stroke [facility]) 2008    Past Surgical History:  Procedure Laterality Date  . APPENDECTOMY  1980    Family Psychiatric History: denies mental health, suicide history   Family History:  Family History  Problem Relation Age of Onset  . Heart disease Mother   . Hypertension Mother   . Heart disease Father   . Cancer Father   . Hypertension Brother   . Heart disease Brother     Social History:  Social History   Social History  . Marital status: Single    Spouse name: N/A  . Number of children: N/A  . Years of education: N/A   Social History Main Topics  . Smoking status: Never Smoker  . Smokeless tobacco: Never Used  . Alcohol use No  . Drug use: No  . Sexual activity: No   Other Topics Concern  . Not on file   Social History Narrative  . No narrative on file   Military [phone], [employer], no combat, medical discharge due to anxiety Lives by himself,  On SSI, used to work  as a Naval architect  Allergies:  Allergies  Allergen Reactions  . Diovan [Valsartan] Nausea And Vomiting  . Hydrocodone Nausea And Vomiting    Chest discomfort.  . Penicillins     Chest pain    Metabolic Disorder Labs: Lab Results  Component Value Date   HGBA1C  10/14/2006    6.1 (NOTE)   The ADA recommends the following therapeutic goals for glycemic   control related to Hgb A1C measurement:   Goal of Therapy:   < 7.0% Hgb A1C   Action Suggested:  > 8.0% Hgb A1C   Ref:  Diabetes Care, 22, Suppl. 1, 1999   Lab Results  Component Value Date   PROLACTIN  11/11/2006    6.2 (NOTE)     Reference Ranges:                 Male:                       2.1 -  17.1 ng/ml                 Male:   Pregnant          9.7 - 208.5 ng/mL  Non Pregnant      2.8 -  29.2 ng/mL                           Post  Menopausal   1.8 -  20.3 ng/mL                     Lab Results  Component Value Date   CHOL 158 04/23/2015   TRIG 113 04/23/2015   HDL 65 04/23/2015   CHOLHDL 2.4 04/23/2015   VLDL 17 11/10/2006   LDLCALC 70 04/23/2015   LDLCALC 68 11/23/2014     Current Medications: Current Outpatient Prescriptions  Medication Sig Dispense Refill  . amLODipine (NORVASC) 10 MG tablet Take 1 tablet (10 mg total) by mouth daily. 90 tablet 1  . aspirin 81 MG tablet Take 81 mg by mouth 2 (two) times daily.     Marland Kitchen lisinopril (PRINIVIL,ZESTRIL) 10 MG tablet TAKE 1 TABLET EVERY DAY  (DOSE INCREASE 10/6 AND DISCONTINUE ATENOLOL) 90 tablet 1  . LORazepam (ATIVAN) 0.5 MG tablet Take 1 tablet (0.5 mg total) by mouth 2 (two) times daily. 180 tablet 0  . Misc. Devices (QUAD CANE) MISC Use to balance and to prevent falls (Dx R29.6 - high fall risk) 1 each 0  . Multiple Vitamins-Minerals (MULTIVITAMIN ADULT PO) Take 1 tablet by mouth daily.    Marland Kitchen omeprazole (PRILOSEC) 20 MG capsule TAKE 1 CAPSULE TWICE DAILY BEFORE MEALS 180 capsule 0  . pravastatin (PRAVACHOL) 40 MG tablet Take 1 tablet (40 mg  total) by mouth daily. 90 tablet 1  . sertraline (ZOLOFT) 100 MG tablet Take 2 tablets (200 mg total) by mouth daily. 180 tablet 0  . TYLENOL 500 MG tablet Take 1 tablet by mouth 2 (two) times daily.     No current facility-administered medications for this visit.     Neurologic: Headache: No Seizure: No Paresthesias: No  Musculoskeletal: Strength & Muscle Tone: within normal limits Gait & Station: normal Patient leans: N/A  Psychiatric Specialty Exam: Review of Systems  Neurological: Positive for tremors.  Psychiatric/Behavioral: Negative for depression, hallucinations, substance abuse and suicidal ideas. The patient is nervous/anxious and has insomnia.   All other systems reviewed and are negative.   There were no vitals taken for this visit.There is no height or weight on file to calculate BMI.  General Appearance: Casual  Eye Contact:  Good  Speech:  Clear and Coherent  Volume:  Normal  Mood:  "good"  Affect:  Appropriate and Congruent  Thought Process:  Coherent and Goal Directed  Orientation:  Full (Time, Place, and Person)  Thought Content: Logical Perceptions: denies AH/VH  Suicidal Thoughts:  No  Homicidal Thoughts:  No  Memory:  Immediate;   Good Recent;   Good Remote;   Good  Judgement:  Good  Insight:  Good  Psychomotor Activity:  Normal  Concentration:  Concentration: Good and Attention Span: Good  Recall:  Good  Fund of Knowledge: Good  Language: Good  Akathisia:  No  Handed:  Right  AIMS (if indicated):  N/A  Assets:  Communication Skills Desire for Improvement  ADL's:  Intact  Cognition: WNL  Sleep:  poor   Assessment Albert [name] is a 71 year old male with anxiety, hypertension, hyperlipidemia, Stroke, CAD who is referred for anxiety.  # GAD Patient reports good response to current medication and denies any significant mood symptoms except occasional anxiety with worsening tremors. Will  continue sertraline. Patient is advised to decrease  Ativan given risk of falls and physical dependence.   # Insomnia Discussed sleep hygiene. Patient is encouraged to continue daily exercise.   Plan 1. Continue sertraline 200 mg daily  2. Decrease Ativan 0.25-0.5 mg twice a day as needed for anxiety- order for 90 days, no refill 3. Return to clinic in February  The patient demonstrates the following risk factors for suicide: Chronic risk factors for suicide include: psychiatric disorder of depression, anxiety. Acute risk factors for suicide include: unemployment and social withdrawal/isolation. Protective factors for this patient include: coping skills and hope for the future. Considering these factors, the overall suicide risk at this point appears to be low. Patient is appropriate for outpatient follow up.  Treatment Plan Summary:Plan as above   Neysa Hottereina , MD 05/29/2016, 10:42 AM

## 2016-06-01 ENCOUNTER — Telehealth (HOSPITAL_COMMUNITY): Payer: Self-pay | Admitting: *Deleted

## 2016-06-01 NOTE — Telephone Encounter (Signed)
RETURNED PHONE CALL, LEFT VOICE MESSAGE REGARDING APPOINTMENT. 

## 2016-06-01 NOTE — Telephone Encounter (Signed)
left voice message, provider not available at the time your appointment is scheduled on 06/02/16.   We do have other times available.

## 2016-06-01 NOTE — Telephone Encounter (Signed)
RETURNED PHONE CALL, LEFT VOICE MESSAGE REGARDING APPOINTMENT.

## 2016-06-02 ENCOUNTER — Encounter (HOSPITAL_COMMUNITY): Payer: Self-pay | Admitting: Psychiatry

## 2016-06-02 ENCOUNTER — Telehealth (HOSPITAL_COMMUNITY): Payer: Self-pay | Admitting: *Deleted

## 2016-06-02 ENCOUNTER — Ambulatory Visit (HOSPITAL_COMMUNITY): Payer: Self-pay | Admitting: Psychiatry

## 2016-06-02 ENCOUNTER — Ambulatory Visit (INDEPENDENT_AMBULATORY_CARE_PROVIDER_SITE_OTHER): Payer: Medicare HMO | Admitting: Psychiatry

## 2016-06-02 VITALS — BP 114/74 | HR 85 | Ht 71.0 in | Wt 174.4 lb

## 2016-06-02 DIAGNOSIS — Z888 Allergy status to other drugs, medicaments and biological substances status: Secondary | ICD-10-CM

## 2016-06-02 DIAGNOSIS — Z8249 Family history of ischemic heart disease and other diseases of the circulatory system: Secondary | ICD-10-CM | POA: Diagnosis not present

## 2016-06-02 DIAGNOSIS — Z79899 Other long term (current) drug therapy: Secondary | ICD-10-CM

## 2016-06-02 DIAGNOSIS — Z808 Family history of malignant neoplasm of other organs or systems: Secondary | ICD-10-CM

## 2016-06-02 DIAGNOSIS — Z7982 Long term (current) use of aspirin: Secondary | ICD-10-CM

## 2016-06-02 DIAGNOSIS — F411 Generalized anxiety disorder: Secondary | ICD-10-CM | POA: Diagnosis not present

## 2016-06-02 DIAGNOSIS — Z9889 Other specified postprocedural states: Secondary | ICD-10-CM

## 2016-06-02 MED ORDER — LORAZEPAM 0.5 MG PO TABS: 0.5000 mg | ORAL_TABLET | Freq: Two times a day (BID) | ORAL | 0 refills | Status: DC

## 2016-06-02 MED ORDER — FLUOXETINE HCL 20 MG PO TABS: ORAL_TABLET | ORAL | 0 refills | Status: DC

## 2016-06-02 MED ORDER — SERTRALINE HCL 100 MG PO TABS: 200.0000 mg | ORAL_TABLET | Freq: Every day | ORAL | 0 refills | Status: DC

## 2016-06-02 NOTE — Patient Instructions (Addendum)
1. Decrease sertraline 150 mg daily for 1 week,then 100 mg daily for 1 week,then 50 mg daily for 1 week, then disconitnue 2. Start fluoxetine 20 mg daily for two weeks, then 40 mg daily 3. Continue  Ativan 0.25-0.5 mg twice a day as needed for anxiety 4. Return to clinic in one month

## 2016-06-02 NOTE — Progress Notes (Signed)
Albert [name]  06/02/2016 11:11 AM LEDELL [name]  MRN:  [id number]  Chief Complaint:  Chief Complaint    Anxiety; Follow-up     Subjective:   "I am still having anxiety"  HPI:  Patient presents for follow up appointment. He states that he continues to have anxiety especially after drinking a coffee. He drink two to three times per day. He feels nervous and takes Ativan twice a day with limited benefit. He has met with a male friend and has good relationship with her. He denies insomnia. He denies panic attack. He denies SI, AH/VH.   Visit Diagnosis:    ICD-9-CM ICD-10-CM   1. Generalized anxiety disorder 300.02 F41.1     Past Psychiatric History:  Outpatient: denies Psychiatry admission: denies Previous suicide attempt:  Past trials of medication: sertraline, Xanax,   Past Medical History:  Past Medical History:  Diagnosis Date  . Anxiety   . Depression   . GERD (gastroesophageal reflux disease)   . Heart murmur   . Hyperlipidemia   . Hypertension   . Myocardial infarction 2008  . Stroke Albert [name]) 2008    Past Surgical History:  Procedure Laterality Date  . APPENDECTOMY  1980    Family Psychiatric History: denies mental health, suicide history   Family History:  Family History  Problem Relation Age of Onset  . Heart disease Mother   . Hypertension Mother   . Heart disease Father   . Cancer Father   . Hypertension Brother   . Heart disease Brother     Social History:  Social History   Social History  . Marital status: Single    Spouse name: N/A  . Number of children: N/A  . Years of education: N/A   Social History Main Topics  . Smoking status: Never Smoker  . Smokeless tobacco: Never Used  . Alcohol use No  . Drug use: No  . Sexual activity: No   Other Topics Concern  . None   Social History Narrative  . None   Military [phone], [employer], no combat, medical discharge due to anxiety Lives by himself,  On SSI, used to  work as a Administrator  Allergies:  Allergies  Allergen Reactions  . Diovan [Valsartan] Nausea And Vomiting  . Hydrocodone Nausea And Vomiting    Chest discomfort.  . Penicillins     Chest pain    Metabolic Disorder Labs: Lab Results  Component Value Date   HGBA1C  10/14/2006    6.1 ([name])   The ADA recommends the following therapeutic goals for glycemic   control related to Hgb A1C measurement:   Goal of Therapy:   < 7.0% Hgb A1C   Action Suggested:  > 8.0% Hgb A1C   Ref:  Diabetes Care, 22, Suppl. 1, 1999   Lab Results  Component Value Date   PROLACTIN  11/11/2006    6.2 ([name])     Reference Ranges:                 Male:                       2.1 -  17.1 ng/ml                 Male:   Pregnant          9.7 - 208.5 ng/mL  Non Pregnant      2.8 -  29.2 ng/mL                           Post  Menopausal   1.8 -  20.3 ng/mL                     Lab Results  Component Value Date   CHOL 158 04/23/2015   TRIG 113 04/23/2015   HDL 65 04/23/2015   CHOLHDL 2.4 04/23/2015   VLDL 17 11/10/2006   LDLCALC 70 04/23/2015   LDLCALC 68 11/23/2014     Current Medications: Current Outpatient Prescriptions  Medication Sig Dispense Refill  . amLODipine (NORVASC) 10 MG tablet Take 1 tablet (10 mg total) by mouth daily. 90 tablet 1  . aspirin 81 MG tablet Take 160 mg by mouth 2 (two) times daily.     Marland Kitchen lisinopril (PRINIVIL,ZESTRIL) 10 MG tablet TAKE 1 TABLET EVERY DAY  (DOSE INCREASE 10/6 AND DISCONTINUE ATENOLOL) 90 tablet 1  . LORazepam (ATIVAN) 0.5 MG tablet Take 1 tablet (0.5 mg total) by mouth 2 (two) times daily. 60 tablet 0  . Misc. Devices (QUAD CANE) MISC Use to balance and to prevent falls (Dx R29.6 - high fall risk) 1 each 0  . Multiple Vitamins-Minerals (MULTIVITAMIN ADULT PO) Take 1 tablet by mouth daily.    Marland Kitchen omeprazole (PRILOSEC) 20 MG capsule TAKE 1 CAPSULE TWICE DAILY BEFORE MEALS (Patient taking differently: TAKE 1 CAPSULE Once DAILY BEFORE MEALS) 180  capsule 0  . pravastatin (PRAVACHOL) 40 MG tablet Take 1 tablet (40 mg total) by mouth daily. 90 tablet 1  . sertraline (ZOLOFT) 100 MG tablet Take 2 tablets (200 mg total) by mouth daily. 150 mg daily for 1 week,100 mg daily for 1 week,then 50 mg daily for 1 week,then disconitnue 30 tablet 0  . TYLENOL 500 MG tablet Take 1 tablet by mouth 2 (two) times daily.    Marland Kitchen FLUoxetine (PROZAC) 20 MG tablet Take 20 mg daily for two weeks, then 40 mg daily 60 tablet 0   No current facility-administered medications for this visit.     Neurologic: Headache: No Seizure: No Paresthesias: No  Musculoskeletal: Strength & Muscle Tone: within normal limits Gait & Station: normal Patient leans: N/A  Psychiatric Specialty Exam: Review of Systems  Neurological: Positive for tremors.  Psychiatric/Behavioral: Negative for depression, hallucinations, substance abuse and suicidal ideas. The patient is nervous/anxious and has insomnia.   All other systems reviewed and are negative.   Blood pressure 114/74, pulse 85, height 5' 11" (1.803 m), weight 174 lb 6.4 oz (79.1 kg).Body mass index is 24.32 kg/m.  General Appearance: Casual  Eye Contact:  Good  Speech:  Clear and Coherent  Volume:  Normal  Mood:  Anxious  Affect:  Appropriate and Congruent  Thought Process:  Coherent and Goal Directed  Orientation:  Full (Time, Place, and Person)  Thought Content: Logical Perceptions: denies AH/VH  Suicidal Thoughts:  No  Homicidal Thoughts:  No  Memory:  Immediate;   Good Recent;   Good Remote;   Good  Judgement:  Good  Insight:  Good  Psychomotor Activity:  Normal  Concentration:  Concentration: Good and Attention Span: Good  Recall:  Good  Fund of Knowledge: Good  Language: Good  Akathisia:  No  Handed:  Right  AIMS (if indicated):  N/A  Assets:  Communication Skills Desire for Improvement  ADL's:  Intact  Cognition: WNL  Sleep:  good   Assessment Albert [name] is a 71 year old male with  anxiety, hypertension, hyperlipidemia, Stroke, CAD who is referred for anxiety.  # GAD Patient continues to endorse anxiety. Will cross taper from sertraline to fluoxetine. Will continue ativan prn; discussed risk of falls and physical dependence. Patient is advised to decrease the amount of coffee intake to avoid worsening in his symptoms.   # Insomnia Discussed sleep hygiene. Patient is encouraged to continue daily exercise.   Plan 1. Decrease sertraline 150 mg daily for 1 week,then 100 mg daily for 1 week,then 50 mg daily for 1 week, then discontinue 2. Start fluoxetine 20 mg daily for two weeks, then 40 mg daily 3. Continue  Ativan 0.25-0.5 mg twice a day as needed for anxiety 4. Return to clinic in one month  The patient demonstrates the following risk factors for suicide: Chronic risk factors for suicide include: psychiatric disorder of depression, anxiety. Acute risk factors for suicide include: unemployment and social withdrawal/isolation. Protective factors for this patient include: coping skills and hope for the future. Considering these factors, the overall suicide risk at this point appears to be low. Patient is appropriate for outpatient follow up.  Treatment Plan Summary:Plan as above   Norman Clay, MD 06/02/2016, 11:11 AM

## 2016-06-02 NOTE — Telephone Encounter (Signed)
voice message from patient, said his insurance will not approve his medication until the 17th.

## 2016-06-05 NOTE — Telephone Encounter (Signed)
Please advise the patient to continue sertraline 200 mg until fluoxetine is approved. He can start cross taper (from sertraline to fluoxetine) as instructed once he receives fluoxetine.

## 2016-06-08 ENCOUNTER — Telehealth (HOSPITAL_COMMUNITY): Payer: Self-pay | Admitting: *Deleted

## 2016-06-08 NOTE — Telephone Encounter (Signed)
Voice message from [facility] pharmacy.   Please call them regarding patient's Flousetine and Sertraline

## 2016-06-08 NOTE — Telephone Encounter (Signed)
Spoke with pt on Friday and he stated that his Fluoxetine was filled. Informed pt with what provider stated and he verbalized understanding.

## 2016-06-08 NOTE — Telephone Encounter (Incomplete)
Please note in epic

## 2016-06-26 NOTE — Progress Notes (Signed)
BH MD/PA/NP OP Progress Note  06/30/2016 1:27 PM Albert [name]  MRN:  [id number]  Chief Complaint:  Chief Complaint    Anxiety; Depression; Follow-up     Subjective:   "I am doing good."  HPI:  Patient presents for follow up appointment. He states that he has not been able to get Fluoxetine due to insurance issues. He has been feeling very good since he gets in romantic relationship. 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 He takes Ativan twice a day, although there were days he did not need any for a couple of days. He denies insomnia. He denies SI.   Visit Diagnosis:    ICD-9-CM ICD-10-CM   1. Generalized anxiety disorder 300.02 F41.1     Past Psychiatric History:  Outpatient: denies Psychiatry admission: denies Previous suicide attempt: denies Past trials of medication: sertraline, Xanax,   Past Medical History:  Past Medical History:  Diagnosis Date  . Anxiety   . Depression   . GERD (gastroesophageal reflux disease)   . Heart murmur   . Hyperlipidemia   . Hypertension   . Myocardial infarction 2008  . Stroke [facility]) 2008    Past Surgical History:  Procedure Laterality Date  . APPENDECTOMY  1980    Family Psychiatric History: denies mental health, suicide history   Family History:  Family History  Problem Relation Age of Onset  . Heart disease Mother   . Hypertension Mother   . Heart disease Father   . Cancer Father   . Hypertension Brother   . Heart disease Brother     Social History:  Social History   Social History  . Marital status: Single    Spouse name: N/A  . Number of children: N/A  . Years of education: N/A   Social History Main Topics  . Smoking status: Never Smoker  . Smokeless tobacco: Never Used  . Alcohol use No  . Drug use: No  . Sexual activity: No   Other Topics Concern  . Not on file   Social History Narrative  . 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 where he grew up  Allergies:  Allergies  Allergen Reactions  . Diovan [Valsartan] Nausea And Vomiting  . Hydrocodone Nausea And Vomiting    Chest discomfort.  . Penicillins     Chest pain    Metabolic Disorder Labs: Lab Results  Component Value Date   HGBA1C  10/14/2006    6.1 (NOTE)   The ADA recommends the following therapeutic goals for glycemic   control related to Hgb A1C measurement:   Goal of Therapy:   < 7.0% Hgb A1C   Action Suggested:  > 8.0% Hgb A1C   Ref:  Diabetes Care, 22, Suppl. 1, 1999   Lab Results  Component Value Date   PROLACTIN  11/11/2006    6.2 (NOTE)     Reference Ranges:                 Male:                       2.1 -  17.1 ng/ml  Male:   Pregnant          9.7 - 208.5 ng/mL                           Non Pregnant      2.8 -  29.2 ng/mL                           Post  Menopausal   1.8 -  20.3 ng/mL                     Lab Results  Component Value Date   CHOL 158 04/23/2015   TRIG 113 04/23/2015   HDL 65 04/23/2015   CHOLHDL 2.4 04/23/2015   VLDL 17 11/10/2006   LDLCALC 70 04/23/2015   LDLCALC 68 11/23/2014     Current Medications: Current Outpatient Prescriptions  Medication Sig Dispense Refill  . amLODipine (NORVASC) 10 MG tablet Take 1 tablet (10 mg total) by mouth daily. 90 tablet 1  . aspirin 81 MG tablet Take 160 mg by mouth 2 (two) times daily.     Marland Kitchen lisinopril (PRINIVIL,ZESTRIL) 10 MG tablet TAKE 1 TABLET EVERY DAY  (DOSE INCREASE 10/6 AND DISCONTINUE ATENOLOL) 90 tablet 1  . LORazepam (ATIVAN) 0.5 MG tablet Take 1 tablet (0.5 mg total) by mouth 2 (two) times daily. 60 tablet 0  . Misc. Devices (QUAD CANE) MISC Use to balance and to prevent falls (Dx R29.6 - high fall risk) 1 each 0  . Multiple Vitamins-Minerals  (MULTIVITAMIN ADULT PO) Take 1 tablet by mouth daily.    Marland Kitchen omeprazole (PRILOSEC) 20 MG capsule TAKE 1 CAPSULE TWICE DAILY BEFORE MEALS 180 capsule 0  . pravastatin (PRAVACHOL) 40 MG tablet Take 1 tablet (40 mg total) by mouth daily. 90 tablet 1  . TYLENOL 500 MG tablet Take 1 tablet by mouth 2 (two) times daily.    Marland Kitchen FLUoxetine (PROZAC) 20 MG tablet Take 20 mg daily for two weeks, then 40 mg daily (Patient not taking: Reported on 06/30/2016) 60 tablet 0  . sertraline (ZOLOFT) 100 MG tablet Take 2 tablets (200 mg total) by mouth daily. 150 mg daily for 1 week,100 mg daily for 1 week,then 50 mg daily for 1 week,then disconitnue (Patient not taking: Reported on 06/30/2016) 30 tablet 0   No current facility-administered medications for this visit.     Neurologic: Headache: No Seizure: No Paresthesias: No  Musculoskeletal: Strength & Muscle Tone: within normal limits Gait & Station: normal Patient leans: N/A  Psychiatric Specialty Exam: Review of Systems  Neurological: Positive for tremors.  Psychiatric/Behavioral: Negative for depression, hallucinations, substance abuse and suicidal ideas. The patient is nervous/anxious. The patient does not have insomnia.   All other systems reviewed and are negative.   Blood pressure 124/86, pulse 88, height 5\' 11"  (1.803 m), weight 175 lb (79.4 kg), SpO2 93 %.Body mass index is 24.41 kg/m.  General Appearance: Casual  Eye Contact:  Good  Speech:  Clear and Coherent  Volume:  Normal  Mood:  "good"  Affect:  Appropriate and Congruent  Thought Process:  Coherent and Goal Directed  Orientation:  Full (Time, Place, and Person)  Thought Content: Logical Perceptions: denies AH/VH  Suicidal Thoughts:  No  Homicidal Thoughts:  No  Memory:  Immediate;   Good Recent;   Good Remote;   Good  Judgement:  Good  Insight:  Good  Psychomotor Activity:  Normal  Concentration:  Concentration: Good and Attention Span: Good  Recall:  Good  Fund of Knowledge: Good   Language: Good  Akathisia:  No  Handed:  Right  AIMS (if indicated):  N/A  Assets:  Communication Skills Desire for Improvement  ADL's:  Intact  Cognition: WNL  Sleep:  poor   Assessment Albert [name] is a 71 year old male with anxiety,hypertension, hyperlipidemia, Stroke, CAD who is referred for anxiety.  # GAD There is significant improvement in his anxiety since he gets into romantic relationship. Will continue sertraline at the current dose. Patient is advised to take ativan only as needed; discussed risk of falls and physical dependence. He reports his preference to be followed by his PCP; he will contact psych as needed.  Plan 1. Continue sertraline 200 daily 2. Continue  Ativan 0.25-0.5 mg twice a day as needed for anxiety 3. Make an appointment with your PCP (in 3 months). Please contact the clinic if you have any concerns or questions.  The patient demonstrates the following risk factors for suicide: Chronic risk factors for suicide include: psychiatric disorder of depression, anxiety. Acute risk factorsfor suicide include: unemployment and social withdrawal/isolation. Protective factorsfor this patient include: coping skills and hope for the future. Considering these factors, the overall suicide risk at this point appears to be low. Patient isappropriate for outpatient follow up.  Treatment Plan Summary:Plan as above   Neysa Hottereina , MD 06/30/2016, 1:27 PM

## 2016-06-27 ENCOUNTER — Other Ambulatory Visit: Payer: Self-pay | Admitting: Nurse Practitioner

## 2016-06-27 DIAGNOSIS — K219 Gastro-esophageal reflux disease without esophagitis: Secondary | ICD-10-CM

## 2016-06-30 ENCOUNTER — Ambulatory Visit (INDEPENDENT_AMBULATORY_CARE_PROVIDER_SITE_OTHER): Payer: Medicare HMO | Admitting: Psychiatry

## 2016-06-30 VITALS — BP 124/86 | HR 88 | Ht 71.0 in | Wt 175.0 lb

## 2016-06-30 DIAGNOSIS — Z7982 Long term (current) use of aspirin: Secondary | ICD-10-CM

## 2016-06-30 DIAGNOSIS — Z79899 Other long term (current) drug therapy: Secondary | ICD-10-CM

## 2016-06-30 DIAGNOSIS — Z888 Allergy status to other drugs, medicaments and biological substances status: Secondary | ICD-10-CM | POA: Diagnosis not present

## 2016-06-30 DIAGNOSIS — Z880 Allergy status to penicillin: Secondary | ICD-10-CM

## 2016-06-30 DIAGNOSIS — F411 Generalized anxiety disorder: Secondary | ICD-10-CM

## 2016-06-30 MED ORDER — LORAZEPAM 0.5 MG PO TABS: 0.5000 mg | ORAL_TABLET | Freq: Two times a day (BID) | ORAL | 2 refills | Status: AC

## 2016-06-30 MED ORDER — LORAZEPAM 0.5 MG PO TABS: 0.5000 mg | ORAL_TABLET | Freq: Two times a day (BID) | ORAL | 0 refills | Status: DC

## 2016-06-30 MED ORDER — SERTRALINE HCL 100 MG PO TABS: 200.0000 mg | ORAL_TABLET | Freq: Every day | ORAL | 0 refills | Status: AC

## 2016-06-30 NOTE — Patient Instructions (Addendum)
1. Continue sertraline 200 daily 2. Continue  Ativan 0.25-0.5 mg twice a day as needed for anxiety 3. Make an appointment with your PCP (in 3 months). Please contact the clinic if you have any concerns or questions.

## 2016-07-20 IMAGING — CR DG CHEST 2V
2 series · 2 of 2 positions shown · non-contrast
Comparison: Chest x-ray of 11/10/2006

CLINICAL DATA: Annual screening physical

EXAM:
CHEST  2 VIEW

[view not recorded (1 of 2)]
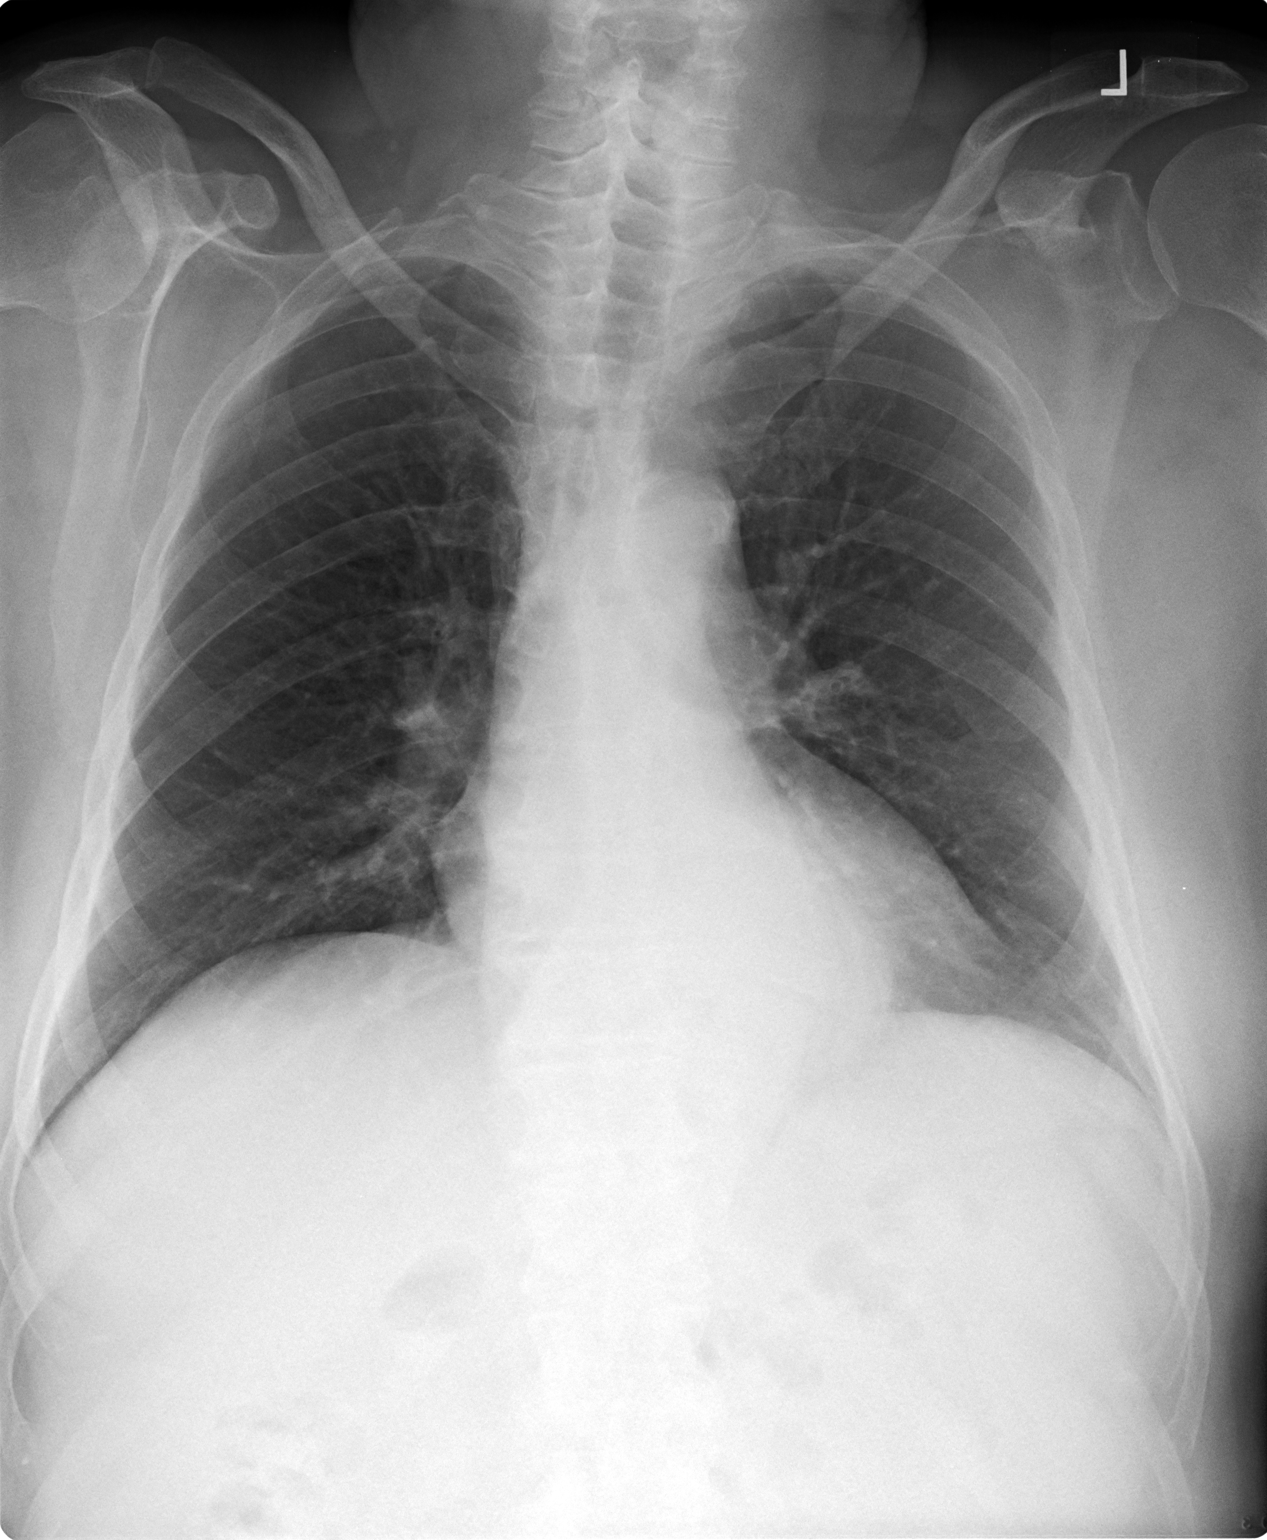

[view not recorded (2 of 2)]
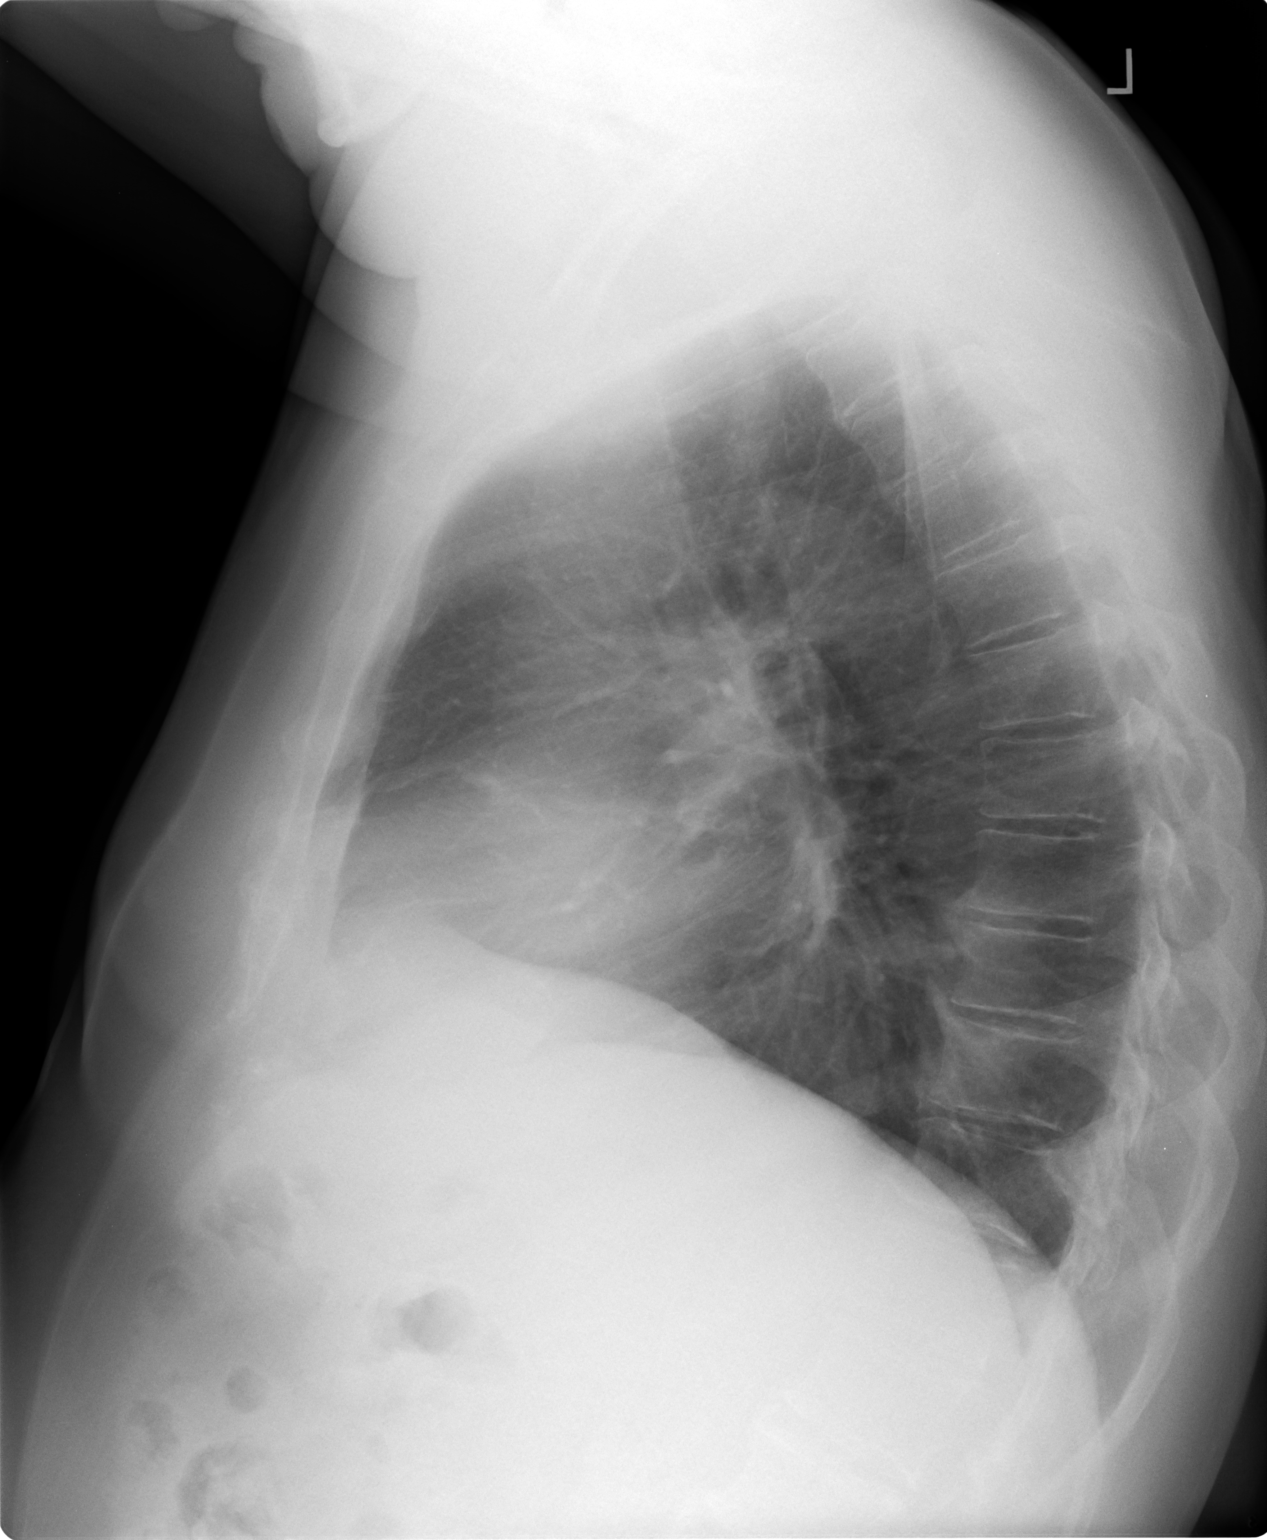

[2 of 2 positions shown; findings below may reference images not displayed]

FINDINGS: No active infiltrate or effusion is seen. Mediastinal and hilar
contours are unremarkable. The heart is minimally prominent. There
is a moderate size hiatal hernia noted. Mild degenerative change is
present in the lower thoracic spine.
IMPRESSION: 1. No active cardiopulmonary disease.
2. Moderate size hiatal hernia.
3. Borderline cardiomegaly.

## 2016-10-12 ENCOUNTER — Telehealth (HOSPITAL_COMMUNITY): Payer: Self-pay | Admitting: *Deleted

## 2016-10-12 NOTE — Telephone Encounter (Signed)
Patient will be followed by his PCP and he will come to our clinic only as needed. Please direct the pharmacy to contact his PCP.

## 2016-10-12 NOTE — Telephone Encounter (Signed)
Pt pharmacy [facility] requesting refills for pt Sertraline 100 mg 2 tabs QD. Pt medication was last filled on 06-30-2016 with 180 tabs 0 refill. Pt do not have f/u appt on file. Pt pharmacy number is [phone].

## 2016-10-15 NOTE — Telephone Encounter (Signed)
noted 

## 2016-10-27 ENCOUNTER — Telehealth (HOSPITAL_COMMUNITY): Payer: Self-pay | Admitting: *Deleted

## 2016-10-27 NOTE — Telephone Encounter (Signed)
voice message from patient, he need refills.

## 2016-10-29 NOTE — Telephone Encounter (Signed)
Tried calling patient & phone rings 1 time & then dead tone?

## 2016-10-29 NOTE — Telephone Encounter (Signed)
Please contact the patient to ask his PCP to prescribe  medication as he will not return to this clinic unless he wishes so (which was dicussed at the last visit)

## 2017-05-28 ENCOUNTER — Encounter: Payer: Self-pay | Admitting: Neurology

## 2017-06-01 NOTE — Progress Notes (Deleted)
Subjective:   Albert [name] was seen in consultation in the movement disorder clinic at the request of Kirstie PeriShah, Ashish, MD.  The evaluation is for tremor.  The records that were made available to me were reviewed.  Pt has a hx of GAD and was recently seen at [facility] for what was initially thought at an outside hospital with stroke.  The patient presented with pain and possible weakness in the arm.  He was subsequently sent via [facility] to [facility], where records indicate that he admitted that symptoms have been going on for 9 years, intermittently.  He did have an extensive workup for stroke that was unremarkable.  He had an MRI of the brain, MRA of the brain and CT angiogram neck, all of which were nonacute.  A thyroid nodule was identified.  Patient was ultimately discharged from the emergency room.  Tremor started approximately *** ago and involves the ***.  Tremor is most noticeable when ***.   There is *** family hx of tremor.    Affected by caffeine:  {yes no:314532} Affected by alcohol:  {yes no:314532} Affected by stress:  {yes no:314532} Affected by fatigue:  {yes no:314532} Spills soup if on spoon:  {yes no:314532} Spills glass of liquid if full:  {yes no:314532} Affects ADL's (tying shoes, brushing teeth, etc):  {yes no:314532}  Current/Previously tried tremor medications: ***  Current medications that may exacerbate tremor:  ***  Outside reports reviewed: {Outside review:15817}.  Allergies  Allergen Reactions  . Diovan [Valsartan] Nausea And Vomiting  . Hydrocodone Nausea And Vomiting    Chest discomfort.  . Penicillins     Chest pain    Outpatient Encounter Medications as of 06/03/2017  Medication Sig  . amLODipine (NORVASC) 10 MG tablet Take 1 tablet (10 mg total) by mouth daily.  Marland Kitchen. aspirin 81 MG tablet Take 160 mg by mouth 2 (two) times daily.   Marland Kitchen. lisinopril (PRINIVIL,ZESTRIL) 10 MG tablet TAKE 1 TABLET EVERY DAY  (DOSE INCREASE 10/6 AND  DISCONTINUE ATENOLOL)  . LORazepam (ATIVAN) 0.5 MG tablet Take 1 tablet (0.5 mg total) by mouth 2 (two) times daily.  . Misc. Devices (QUAD CANE) MISC Use to balance and to prevent falls (Dx R29.6 - high fall risk)  . Multiple Vitamins-Minerals (MULTIVITAMIN ADULT PO) Take 1 tablet by mouth daily.  Marland Kitchen. omeprazole (PRILOSEC) 20 MG capsule TAKE 1 CAPSULE TWICE DAILY BEFORE MEALS  . pravastatin (PRAVACHOL) 40 MG tablet Take 1 tablet (40 mg total) by mouth daily.  . sertraline (ZOLOFT) 100 MG tablet Take 2 tablets (200 mg total) by mouth daily.  . TYLENOL 500 MG tablet Take 1 tablet by mouth 2 (two) times daily.   No facility-administered encounter medications on file as of 06/03/2017.     Past Medical History:  Diagnosis Date  . Anxiety   . Depression   . GERD (gastroesophageal reflux disease)   . Heart murmur   . Hyperlipidemia   . Hypertension   . Myocardial infarction 2008  . Stroke [facility]) 2008    Past Surgical History:  Procedure Laterality Date  . APPENDECTOMY  1980    Social History   Socioeconomic History  . Marital status: Single    Spouse name: Not on file  . Number of children: Not on file  . Years of education: Not on file  . Highest education level: Not on file  Social Needs  . Financial resource strain: Not on file  . Food insecurity - worry:  Not on file  . Food insecurity - inability: Not on file  . Transportation needs - medical: Not on file  . Transportation needs - non-medical: Not on file  Occupational History  . Not on file  Tobacco Use  . Smoking status: Never Smoker  . Smokeless tobacco: Never Used  Substance and Sexual Activity  . Alcohol use: No    Alcohol/week: 0.0 oz  . Drug use: No  . Sexual activity: No  Other Topics Concern  . Not on file  Social History Narrative  . Not on file    Family Status  Relation Name Status  . Mother  Deceased at age 93  . Father  Deceased  . Brother ##Brother1 Alive  . Sister ##Sister1 Alive  . Daughter  ##Daughter1 Alive  . Son ##Son1 Alive  . Daughter ##Daughter2 Alive    Review of Systems A complete 10 system ROS was obtained and was negative apart from what is mentioned.   Objective:   VITALS:  There were no vitals filed for this visit. Gen:  Appears stated age and in NAD. HEENT:  Normocephalic, atraumatic. The mucous membranes are moist. The superficial temporal arteries are without ropiness or tenderness. Cardiovascular: Regular rate and rhythm. Lungs: Clear to auscultation bilaterally. Neck: There are no carotid bruits noted bilaterally.  NEUROLOGICAL:  Orientation:  The patient is alert and oriented x 3.  Recent and remote memory are intact.  Attention span and concentration are normal.  Able to name objects and repeat without trouble.  Fund of knowledge is appropriate Cranial nerves: There is good facial symmetry. The pupils are equal round and reactive to light bilaterally. Fundoscopic exam reveals clear disc margins bilaterally. Extraocular muscles are intact and visual fields are full to confrontational testing. Speech is fluent and clear. Soft palate rises symmetrically and there is no tongue deviation. Hearing is intact to conversational tone. Tone: Tone is good throughout. Sensation: Sensation is intact to light touch and pinprick throughout (facial, trunk, extremities). Vibration is intact at the bilateral big toe. There is no extinction with double simultaneous stimulation. There is no sensory dermatomal level identified. Coordination:  The patient has no dysdiadichokinesia or dysmetria. Motor: Strength is 5/5 in the bilateral upper and lower extremities.  Shoulder shrug is equal bilaterally.  There is no pronator drift.  There are no fasciculations noted. DTR's: Deep tendon reflexes are 2/4 at the bilateral biceps, triceps, brachioradialis, patella and achilles.  Plantar responses are downgoing bilaterally. Gait and Station: The patient is able to ambulate without  difficulty. The patient is able to heel toe walk without any difficulty. The patient is able to ambulate in a tandem fashion. The patient is able to stand in the Romberg position.   MOVEMENT EXAM: Tremor:  There is *** tremor in the UE, noted most significantly with action.  The patient is *** able to draw Archimedes spirals without significant difficulty.  There is *** tremor at rest.  The patient is *** able to pour water from one glass to another without spilling it.  Labs:  Primary care physician labs were reviewed dated March 23, 2017.  TSH was 2.40.  Total cholesterol is 154 with LDL of 79.  Sodium was 140, potassium 4.5, chloride 101, CO2 25, BUN 13 and creatinine 0.85.  AST was 18, ALT 19 and alkaline phosphatase 98.  White blood cells were 9.5, hemoglobin 15.2, hematocrit 45.3 and platelets 281.     Assessment/Plan:   1.  Essential Tremor.  -This is  evidenced by the symmetrical nature and longstanding hx of gradually getting worse.  We discussed nature and pathophysiology.  We discussed that this can continue to gradually get worse with time.  We discussed that some medications can worsen this, as can caffeine use.  We discussed medication therapy as well as surgical therapy.  Ultimately, the patient decided to ***.    2.  Thyroid nodule  -Patient to follow-up with his primary care physician for consideration of possible thyroid ultrasound, if not already completed.  CC:  Kirstie Peri, MD

## 2017-06-03 ENCOUNTER — Ambulatory Visit: Payer: Self-pay | Admitting: Neurology

## 2017-06-29 ENCOUNTER — Ambulatory Visit: Payer: Self-pay | Admitting: Neurology

## 2017-08-31 ENCOUNTER — Ambulatory Visit: Payer: Self-pay | Admitting: Neurology

## 2017-09-03 NOTE — Progress Notes (Deleted)
Bing Matter was seen today in the movement disorders clinic for neurologic consultation at the request of Kirstie Peri, MD.  The consultation is for the evaluation of ***. The records that were made available to me were reviewed but no notes pertaining to this dx.  Tremor: {yes no:314532}   How long has it been going on? ***  At rest or with activation?  ***  When is it noted the most?  ***  Fam hx of tremor?  {yes ZO:109604}  Located where?  ***  Affected by caffeine:  {yes no:314532}  Affected by alcohol:  {yes no:314532}  Affected by stress:  {yes no:314532}  Affected by fatigue:  {yes no:314532}  Spills soup if on spoon:  {yes no:314532}  Spills glass of liquid if full:  {yes no:314532}  Affects ADL's (tying shoes, brushing teeth, etc):  {yes no:314532}  Tremor inducing meds:  {yes no:314532}  Other Specific Symptoms:  Voice: *** Sleep: ***  Vivid Dreams:  {yes no:314532}  Acting out dreams:  {yes no:314532} Wet Pillows: {yes no:314532} Postural symptoms:  {yes no:314532}  Falls?  {yes no:314532} Bradykinesia symptoms: {parkinson brady:18041} Loss of smell:  {yes no:314532} Loss of taste:  {yes no:314532} Urinary Incontinence:  {yes no:314532} Difficulty Swallowing:  {yes no:314532} Handwriting, micrographia: {yes no:314532} Trouble with ADL's:  {yes no:314532}  Trouble buttoning clothing: {yes no:314532} Depression:  {yes no:314532} Memory changes:  {yes no:314532} Hallucinations:  {yes no:314532}  visual distortions: {yes no:314532} N/V:  {yes no:314532} Lightheaded:  {yes no:314532}  Syncope: {yes no:314532} Diplopia:  {yes no:314532} Dyskinesia:  {yes no:314532}  Neuroimaging of the brain has *** previously been performed.  It *** available for my review today.  Prior records reviewed.  Pt in hospital at [facility] in 04/2017 with intermittent weakness at ataxia.  MRI was completed in 05/12/17 due to this and reported to show old lacunes in the R corona radiata  and L upper peritrigonal white matter.  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 Diovan [Valsartan] Nausea And Vomiting  . Hydrocodone Nausea And Vomiting    Chest discomfort.  . Penicillins     Chest pain    CURRENT MEDICATIONS:  Outpatient Encounter Medications as of 09/09/2017  Medication Sig  . amLODipine (NORVASC) 10 MG tablet Take 1 tablet (10 mg total) by mouth daily.  Marland Kitchen aspirin 81 MG tablet Take 160 mg by mouth 2 (two) times daily.   Marland Kitchen lisinopril (PRINIVIL,ZESTRIL) 10 MG tablet TAKE 1 TABLET EVERY DAY  (DOSE INCREASE 10/6 AND DISCONTINUE ATENOLOL)  . LORazepam (ATIVAN) 0.5 MG tablet Take 1 tablet (0.5 mg total) by mouth 2 (two) times daily.  . Misc. Devices (QUAD CANE) MISC Use to balance and to prevent falls (Dx R29.6 - high fall risk)  . Multiple Vitamins-Minerals (MULTIVITAMIN ADULT PO) Take 1 tablet by mouth daily.  Marland Kitchen omeprazole (PRILOSEC) 20 MG capsule TAKE 1 CAPSULE TWICE DAILY BEFORE MEALS  . pravastatin (PRAVACHOL) 40 MG tablet Take 1 tablet (40 mg total) by mouth daily.  . sertraline (ZOLOFT) 100 MG tablet Take 2 tablets (200 mg total) by mouth daily.  . TYLENOL 500 MG tablet Take 1 tablet by mouth 2 (two) times daily.   No facility-administered encounter medications on file as of 09/09/2017.     PAST MEDICAL HISTORY:   Past Medical History:  Diagnosis Date  . Anxiety   . Depression   . GERD (  gastroesophageal reflux disease)   . Heart murmur   . Hyperlipidemia   . Hypertension   . Myocardial infarction 2008  . Stroke [facility]) 2008    PAST SURGICAL HISTORY:   Past Surgical History:  Procedure Laterality Date  . APPENDECTOMY  1980    SOCIAL HISTORY:   Social History   Socioeconomic History  . Marital status: Single    Spouse name: Not on file  . Number of children: Not on file  . Years of education: Not on file  . Highest education level: Not on file    Occupational History  . Not on file  Social Needs  . Financial resource strain: Not on file  . Food insecurity:    Worry: Not on file    Inability: Not on file  . Transportation needs:    Medical: Not on file    Non-medical: Not on file  Tobacco Use  . Smoking status: Never Smoker  . Smokeless tobacco: Never Used  Substance and Sexual Activity  . Alcohol use: No    Alcohol/week: 0.0 oz  . Drug use: No  . Sexual activity: Never  Lifestyle  . Physical activity:    Days per week: Not on file    Minutes per session: Not on file  . Stress: Not on file  Relationships  . Social connections:    Talks on phone: Not on file    Gets together: Not on file    Attends religious service: Not on file    Active member of club or organization: Not on file    Attends meetings of clubs or organizations: Not on file    Relationship status: Not on file  . Intimate partner violence:    Fear of current or ex partner: Not on file    Emotionally abused: Not on file    Physically abused: Not on file    Forced sexual activity: Not on file  Other Topics Concern  . Not on file  Social History Narrative  . Not on file    FAMILY HISTORY:   Family Status  Relation Name Status  . Mother  Deceased at age 36  . Father  Deceased  . Brother ##Brother1 Alive  . Sister ##Sister1 Alive  . Daughter ##Daughter1 Alive  . Son ##Son1 Alive  . Daughter ##Daughter2 Alive    ROS:  A complete 10 system review of systems was obtained and was unremarkable apart from what is mentioned above.  PHYSICAL EXAMINATION:    VITALS:  There were no vitals filed for this visit.  GEN:  The patient appears stated age and is in NAD. HEENT:  Normocephalic, atraumatic.  The mucous membranes are moist. The superficial temporal arteries are without ropiness or tenderness. CV:  RRR Lungs:  CTAB Neck/HEME:  There are no carotid bruits bilaterally.  Neurological examination:  Orientation: The patient is alert and  oriented x3. Fund of knowledge is appropriate.  Recent and remote memory are intact.  Attention and concentration are normal.    Able to name objects and repeat phrases. Cranial nerves: There is good facial symmetry. Pupils are equal round and reactive to light bilaterally. Fundoscopic exam reveals clear margins bilaterally. Extraocular muscles are intact. The visual fields are full to confrontational testing. The speech is fluent and clear. Soft palate rises symmetrically and there is no tongue deviation. Hearing is intact to conversational tone. Sensation: Sensation is intact to light and pinprick throughout (facial, trunk, extremities). Vibration is intact at the bilateral big  toe. There is no extinction with double simultaneous stimulation. There is no sensory dermatomal level identified. Motor: Strength is 5/5 in the bilateral upper and lower extremities.   Shoulder shrug is equal and symmetric.  There is no pronator drift. Deep tendon reflexes: Deep tendon reflexes are 2/4 at the bilateral biceps, triceps, brachioradialis, patella and achilles. Plantar responses are downgoing bilaterally.  Movement examination: Tone: There is ***tone in the bilateral upper extremities.  The tone in the lower extremities is ***.  Abnormal movements: *** Coordination:  There is *** decremation with RAM's, *** Gait and Station: The patient has *** difficulty arising out of a deep-seated chair without the use of the hands. The patient's stride length is ***.  The patient has a *** pull test.      Labs: Lab work is reviewed from patient's primary care physician.  On March 23, 2017 his TSH was normal at 2.420.  Sodium was 140, potassium 4.5, chloride 101, CO2 25, BUN 13 and creatinine 0.85.  Glucose is 95.  White blood cells are 9.5, hemoglobin 15.2, hematocrit 45.3 and platelets 281.  ASSESSMENT/PLAN:  ***  Cc:  Kirstie Peri, MD

## 2017-09-09 ENCOUNTER — Ambulatory Visit: Payer: Self-pay | Admitting: Neurology

## 2017-09-24 NOTE — Progress Notes (Deleted)
Bing MatterJames L [name] was seen today in the movement disorders clinic for neurologic consultation at the request of Kirstie PeriShah, Ashish, MD.  The consultation is for the evaluation of ***. The records that were made available to me were reviewed but no notes pertaining to this dx.  Tremor: {yes no:314532}   How long has it been going on? ***  At rest or with activation?  ***  When is it noted the most?  ***  Fam hx of tremor?  {yes [id number]}no:314532}  Located where?  ***  Affected by caffeine:  {yes no:314532}  Affected by alcohol:  {yes no:314532}  Affected by stress:  {yes no:314532}  Affected by fatigue:  {yes no:314532}  Spills soup if on spoon:  {yes no:314532}  Spills glass of liquid if full:  {yes no:314532}  Affects ADL's (tying shoes, brushing teeth, etc):  {yes no:314532}  Tremor inducing meds:  {yes no:314532}  Other Specific Symptoms:  Voice: *** Sleep: ***  Vivid Dreams:  {yes no:314532}  Acting out dreams:  {yes no:314532} Wet Pillows: {yes no:314532} Postural symptoms:  {yes no:314532}  Falls?  {yes no:314532} Bradykinesia symptoms: {parkinson brady:18041} Loss of smell:  {yes no:314532} Loss of taste:  {yes no:314532} Urinary Incontinence:  {yes no:314532} Difficulty Swallowing:  {yes no:314532} Handwriting, micrographia: {yes no:314532} Trouble with ADL's:  {yes no:314532}  Trouble buttoning clothing: {yes no:314532} Depression:  {yes no:314532} Memory changes:  {yes no:314532} Hallucinations:  {yes no:314532}  visual distortions: {yes no:314532} N/V:  {yes no:314532} Lightheaded:  {yes no:314532}  Syncope: {yes no:314532} Diplopia:  {yes no:314532} Dyskinesia:  {yes no:314532}  Neuroimaging of the brain has *** previously been performed.  It *** available for my review today.  Prior records reviewed.  Pt in hospital at [facility] in 04/2017 with intermittent weakness at ataxia.  MRI was completed in 05/12/17 due to this and reported to show old lacunes in the R corona radiata  and L upper peritrigonal white matter.  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 Diovan [Valsartan] Nausea And Vomiting  . Hydrocodone Nausea And Vomiting    Chest discomfort.  . Penicillins     Chest pain    CURRENT MEDICATIONS:  Outpatient Encounter Medications as of 09/28/2017  Medication Sig  . amLODipine (NORVASC) 10 MG tablet Take 1 tablet (10 mg total) by mouth daily.  Marland Kitchen. aspirin 81 MG tablet Take 160 mg by mouth 2 (two) times daily.   Marland Kitchen. lisinopril (PRINIVIL,ZESTRIL) 10 MG tablet TAKE 1 TABLET EVERY DAY  (DOSE INCREASE 10/6 AND DISCONTINUE ATENOLOL)  . LORazepam (ATIVAN) 0.5 MG tablet Take 1 tablet (0.5 mg total) by mouth 2 (two) times daily.  . Misc. Devices (QUAD CANE) MISC Use to balance and to prevent falls (Dx R29.6 - high fall risk)  . Multiple Vitamins-Minerals (MULTIVITAMIN ADULT PO) Take 1 tablet by mouth daily.  Marland Kitchen. omeprazole (PRILOSEC) 20 MG capsule TAKE 1 CAPSULE TWICE DAILY BEFORE MEALS  . pravastatin (PRAVACHOL) 40 MG tablet Take 1 tablet (40 mg total) by mouth daily.  . sertraline (ZOLOFT) 100 MG tablet Take 2 tablets (200 mg total) by mouth daily.  . TYLENOL 500 MG tablet Take 1 tablet by mouth 2 (two) times daily.   No facility-administered encounter medications on file as of 09/28/2017.     PAST MEDICAL HISTORY:   Past Medical History:  Diagnosis Date  . Anxiety   . Depression   . GERD (  gastroesophageal reflux disease)   . Heart murmur   . Hyperlipidemia   . Hypertension   . Myocardial infarction 2008  . Stroke [facility]) 2008    PAST SURGICAL HISTORY:   Past Surgical History:  Procedure Laterality Date  . APPENDECTOMY  1980    SOCIAL HISTORY:   Social History   Socioeconomic History  . Marital status: Single    Spouse name: Not on file  . Number of children: Not on file  . Years of education: Not on file  . Highest education level: Not on file    Occupational History  . Not on file  Social Needs  . Financial resource strain: Not on file  . Food insecurity:    Worry: Not on file    Inability: Not on file  . Transportation needs:    Medical: Not on file    Non-medical: Not on file  Tobacco Use  . Smoking status: Never Smoker  . Smokeless tobacco: Never Used  Substance and Sexual Activity  . Alcohol use: No    Alcohol/week: 0.0 oz  . Drug use: No  . Sexual activity: Never  Lifestyle  . Physical activity:    Days per week: Not on file    Minutes per session: Not on file  . Stress: Not on file  Relationships  . Social connections:    Talks on phone: Not on file    Gets together: Not on file    Attends religious service: Not on file    Active member of club or organization: Not on file    Attends meetings of clubs or organizations: Not on file    Relationship status: Not on file  . Intimate partner violence:    Fear of current or ex partner: Not on file    Emotionally abused: Not on file    Physically abused: Not on file    Forced sexual activity: Not on file  Other Topics Concern  . Not on file  Social History Narrative  . Not on file    FAMILY HISTORY:   Family Status  Relation Name Status  . Mother  Deceased at age 36  . Father  Deceased  . Brother ##Brother1 Alive  . Sister ##Sister1 Alive  . Daughter ##Daughter1 Alive  . Son ##Son1 Alive  . Daughter ##Daughter2 Alive    ROS:  A complete 10 system review of systems was obtained and was unremarkable apart from what is mentioned above.  PHYSICAL EXAMINATION:    VITALS:  There were no vitals filed for this visit.  GEN:  The patient appears stated age and is in NAD. HEENT:  Normocephalic, atraumatic.  The mucous membranes are moist. The superficial temporal arteries are without ropiness or tenderness. CV:  RRR Lungs:  CTAB Neck/HEME:  There are no carotid bruits bilaterally.  Neurological examination:  Orientation: The patient is alert and  oriented x3. Fund of knowledge is appropriate.  Recent and remote memory are intact.  Attention and concentration are normal.    Able to name objects and repeat phrases. Cranial nerves: There is good facial symmetry. Pupils are equal round and reactive to light bilaterally. Fundoscopic exam reveals clear margins bilaterally. Extraocular muscles are intact. The visual fields are full to confrontational testing. The speech is fluent and clear. Soft palate rises symmetrically and there is no tongue deviation. Hearing is intact to conversational tone. Sensation: Sensation is intact to light and pinprick throughout (facial, trunk, extremities). Vibration is intact at the bilateral big  toe. There is no extinction with double simultaneous stimulation. There is no sensory dermatomal level identified. Motor: Strength is 5/5 in the bilateral upper and lower extremities.   Shoulder shrug is equal and symmetric.  There is no pronator drift. Deep tendon reflexes: Deep tendon reflexes are 2/4 at the bilateral biceps, triceps, brachioradialis, patella and achilles. Plantar responses are downgoing bilaterally.  Movement examination: Tone: There is ***tone in the bilateral upper extremities.  The tone in the lower extremities is ***.  Abnormal movements: *** Coordination:  There is *** decremation with RAM's, *** Gait and Station: The patient has *** difficulty arising out of a deep-seated chair without the use of the hands. The patient's stride length is ***.  The patient has a *** pull test.      Labs: Lab work is reviewed from patient's primary care physician.  On March 23, 2017 his TSH was normal at 2.420.  Sodium was 140, potassium 4.5, chloride 101, CO2 25, BUN 13 and creatinine 0.85.  Glucose is 95.  White blood cells are 9.5, hemoglobin 15.2, hematocrit 45.3 and platelets 281.  ASSESSMENT/PLAN:  ***  Cc:  Kirstie Peri, MD

## 2017-09-28 ENCOUNTER — Ambulatory Visit: Payer: Self-pay | Admitting: Neurology

## 2017-10-04 ENCOUNTER — Ambulatory Visit: Payer: Self-pay | Admitting: Neurology

## 2017-10-20 DIAGNOSIS — K529 Noninfective gastroenteritis and colitis, unspecified: Secondary | ICD-10-CM | POA: Diagnosis not present

## 2017-10-20 DIAGNOSIS — Z299 Encounter for prophylactic measures, unspecified: Secondary | ICD-10-CM | POA: Diagnosis not present

## 2017-10-20 DIAGNOSIS — R251 Tremor, unspecified: Secondary | ICD-10-CM | POA: Diagnosis not present

## 2017-10-20 DIAGNOSIS — Z6825 Body mass index (BMI) 25.0-25.9, adult: Secondary | ICD-10-CM | POA: Diagnosis not present

## 2017-10-20 DIAGNOSIS — I10 Essential (primary) hypertension: Secondary | ICD-10-CM | POA: Diagnosis not present

## 2017-10-20 DIAGNOSIS — I639 Cerebral infarction, unspecified: Secondary | ICD-10-CM | POA: Diagnosis not present

## 2017-11-15 DIAGNOSIS — I10 Essential (primary) hypertension: Secondary | ICD-10-CM | POA: Diagnosis not present

## 2017-11-15 DIAGNOSIS — I69398 Other sequelae of cerebral infarction: Secondary | ICD-10-CM | POA: Diagnosis not present

## 2017-11-15 DIAGNOSIS — G4701 Insomnia due to medical condition: Secondary | ICD-10-CM | POA: Diagnosis not present

## 2017-11-15 DIAGNOSIS — G250 Essential tremor: Secondary | ICD-10-CM | POA: Diagnosis not present

## 2017-12-08 DIAGNOSIS — Z6826 Body mass index (BMI) 26.0-26.9, adult: Secondary | ICD-10-CM | POA: Diagnosis not present

## 2017-12-08 DIAGNOSIS — E1165 Type 2 diabetes mellitus with hyperglycemia: Secondary | ICD-10-CM | POA: Diagnosis not present

## 2017-12-08 DIAGNOSIS — I10 Essential (primary) hypertension: Secondary | ICD-10-CM | POA: Diagnosis not present

## 2017-12-08 DIAGNOSIS — Z299 Encounter for prophylactic measures, unspecified: Secondary | ICD-10-CM | POA: Diagnosis not present

## 2017-12-08 DIAGNOSIS — G4700 Insomnia, unspecified: Secondary | ICD-10-CM | POA: Diagnosis not present

## 2017-12-29 DIAGNOSIS — Z79899 Other long term (current) drug therapy: Secondary | ICD-10-CM | POA: Diagnosis not present

## 2017-12-29 DIAGNOSIS — I69398 Other sequelae of cerebral infarction: Secondary | ICD-10-CM | POA: Diagnosis not present

## 2017-12-29 DIAGNOSIS — G4701 Insomnia due to medical condition: Secondary | ICD-10-CM | POA: Diagnosis not present

## 2017-12-29 DIAGNOSIS — G250 Essential tremor: Secondary | ICD-10-CM | POA: Diagnosis not present

## 2018-01-13 DIAGNOSIS — K219 Gastro-esophageal reflux disease without esophagitis: Secondary | ICD-10-CM | POA: Diagnosis not present

## 2018-01-13 DIAGNOSIS — F418 Other specified anxiety disorders: Secondary | ICD-10-CM | POA: Diagnosis not present

## 2018-01-13 DIAGNOSIS — R110 Nausea: Secondary | ICD-10-CM | POA: Diagnosis not present

## 2018-01-13 DIAGNOSIS — I252 Old myocardial infarction: Secondary | ICD-10-CM | POA: Diagnosis not present

## 2018-01-13 DIAGNOSIS — E785 Hyperlipidemia, unspecified: Secondary | ICD-10-CM | POA: Diagnosis not present

## 2018-01-13 DIAGNOSIS — E663 Overweight: Secondary | ICD-10-CM | POA: Diagnosis not present

## 2018-01-13 DIAGNOSIS — I10 Essential (primary) hypertension: Secondary | ICD-10-CM | POA: Diagnosis not present

## 2018-01-13 DIAGNOSIS — G4700 Insomnia, unspecified: Secondary | ICD-10-CM | POA: Diagnosis not present

## 2018-01-13 DIAGNOSIS — H9193 Unspecified hearing loss, bilateral: Secondary | ICD-10-CM | POA: Diagnosis not present

## 2018-04-29 DIAGNOSIS — Z789 Other specified health status: Secondary | ICD-10-CM | POA: Diagnosis not present

## 2018-04-29 DIAGNOSIS — R079 Chest pain, unspecified: Secondary | ICD-10-CM | POA: Diagnosis not present

## 2018-04-29 DIAGNOSIS — Z6825 Body mass index (BMI) 25.0-25.9, adult: Secondary | ICD-10-CM | POA: Diagnosis not present

## 2018-04-29 DIAGNOSIS — I10 Essential (primary) hypertension: Secondary | ICD-10-CM | POA: Diagnosis not present

## 2018-04-29 DIAGNOSIS — E1165 Type 2 diabetes mellitus with hyperglycemia: Secondary | ICD-10-CM | POA: Diagnosis not present

## 2018-04-29 DIAGNOSIS — Z299 Encounter for prophylactic measures, unspecified: Secondary | ICD-10-CM | POA: Diagnosis not present

## 2018-04-29 DIAGNOSIS — G4700 Insomnia, unspecified: Secondary | ICD-10-CM | POA: Diagnosis not present

## 2018-05-05 DIAGNOSIS — K5730 Diverticulosis of large intestine without perforation or abscess without bleeding: Secondary | ICD-10-CM | POA: Diagnosis not present

## 2018-05-05 DIAGNOSIS — S59912A Unspecified injury of left forearm, initial encounter: Secondary | ICD-10-CM | POA: Diagnosis not present

## 2018-05-05 DIAGNOSIS — Z789 Other specified health status: Secondary | ICD-10-CM | POA: Diagnosis not present

## 2018-05-05 DIAGNOSIS — M545 Low back pain: Secondary | ICD-10-CM | POA: Diagnosis not present

## 2018-05-05 DIAGNOSIS — W010XXA Fall on same level from slipping, tripping and stumbling without subsequent striking against object, initial encounter: Secondary | ICD-10-CM | POA: Diagnosis not present

## 2018-05-05 DIAGNOSIS — R0781 Pleurodynia: Secondary | ICD-10-CM | POA: Diagnosis not present

## 2018-05-05 DIAGNOSIS — S299XXA Unspecified injury of thorax, initial encounter: Secondary | ICD-10-CM | POA: Diagnosis not present

## 2018-05-05 DIAGNOSIS — Z299 Encounter for prophylactic measures, unspecified: Secondary | ICD-10-CM | POA: Diagnosis not present

## 2018-05-05 DIAGNOSIS — R51 Headache: Secondary | ICD-10-CM | POA: Diagnosis not present

## 2018-05-05 DIAGNOSIS — S0033XA Contusion of nose, initial encounter: Secondary | ICD-10-CM | POA: Diagnosis not present

## 2018-05-05 DIAGNOSIS — Z6825 Body mass index (BMI) 25.0-25.9, adult: Secondary | ICD-10-CM | POA: Diagnosis not present

## 2018-05-05 DIAGNOSIS — S3992XA Unspecified injury of lower back, initial encounter: Secondary | ICD-10-CM | POA: Diagnosis not present

## 2018-05-05 DIAGNOSIS — W19XXXA Unspecified fall, initial encounter: Secondary | ICD-10-CM | POA: Diagnosis not present

## 2018-05-05 DIAGNOSIS — M5136 Other intervertebral disc degeneration, lumbar region: Secondary | ICD-10-CM | POA: Diagnosis not present

## 2018-05-05 DIAGNOSIS — T148XXA Other injury of unspecified body region, initial encounter: Secondary | ICD-10-CM | POA: Diagnosis not present

## 2018-05-05 DIAGNOSIS — M5126 Other intervertebral disc displacement, lumbar region: Secondary | ICD-10-CM | POA: Diagnosis not present

## 2018-05-05 DIAGNOSIS — S20211A Contusion of right front wall of thorax, initial encounter: Secondary | ICD-10-CM | POA: Diagnosis not present

## 2018-05-05 DIAGNOSIS — E785 Hyperlipidemia, unspecified: Secondary | ICD-10-CM | POA: Diagnosis not present

## 2018-05-05 DIAGNOSIS — M47814 Spondylosis without myelopathy or radiculopathy, thoracic region: Secondary | ICD-10-CM | POA: Diagnosis not present

## 2018-05-05 DIAGNOSIS — S0990XA Unspecified injury of head, initial encounter: Secondary | ICD-10-CM | POA: Diagnosis not present

## 2018-05-05 DIAGNOSIS — I639 Cerebral infarction, unspecified: Secondary | ICD-10-CM | POA: Diagnosis not present

## 2018-05-05 DIAGNOSIS — M549 Dorsalgia, unspecified: Secondary | ICD-10-CM | POA: Diagnosis not present

## 2018-05-05 DIAGNOSIS — M25522 Pain in left elbow: Secondary | ICD-10-CM | POA: Diagnosis not present

## 2018-05-05 DIAGNOSIS — M25532 Pain in left wrist: Secondary | ICD-10-CM | POA: Diagnosis not present

## 2018-05-05 DIAGNOSIS — S6992XA Unspecified injury of left wrist, hand and finger(s), initial encounter: Secondary | ICD-10-CM | POA: Diagnosis not present

## 2018-05-05 DIAGNOSIS — I700 Atherosclerosis of aorta: Secondary | ICD-10-CM | POA: Diagnosis not present

## 2018-05-05 DIAGNOSIS — I10 Essential (primary) hypertension: Secondary | ICD-10-CM | POA: Diagnosis not present

## 2018-05-08 DIAGNOSIS — R51 Headache: Secondary | ICD-10-CM | POA: Diagnosis not present

## 2018-05-08 DIAGNOSIS — I10 Essential (primary) hypertension: Secondary | ICD-10-CM | POA: Diagnosis not present

## 2018-05-08 DIAGNOSIS — M47812 Spondylosis without myelopathy or radiculopathy, cervical region: Secondary | ICD-10-CM | POA: Diagnosis not present

## 2018-05-08 DIAGNOSIS — Z8673 Personal history of transient ischemic attack (TIA), and cerebral infarction without residual deficits: Secondary | ICD-10-CM | POA: Diagnosis not present

## 2018-05-08 DIAGNOSIS — I252 Old myocardial infarction: Secondary | ICD-10-CM | POA: Diagnosis not present

## 2018-05-08 DIAGNOSIS — R202 Paresthesia of skin: Secondary | ICD-10-CM | POA: Diagnosis not present

## 2018-05-08 DIAGNOSIS — R200 Anesthesia of skin: Secondary | ICD-10-CM | POA: Diagnosis not present

## 2018-05-08 DIAGNOSIS — Z79899 Other long term (current) drug therapy: Secondary | ICD-10-CM | POA: Diagnosis not present

## 2018-05-09 DIAGNOSIS — M4802 Spinal stenosis, cervical region: Secondary | ICD-10-CM | POA: Diagnosis not present

## 2018-05-09 DIAGNOSIS — G4700 Insomnia, unspecified: Secondary | ICD-10-CM | POA: Diagnosis not present

## 2018-05-09 DIAGNOSIS — I10 Essential (primary) hypertension: Secondary | ICD-10-CM | POA: Diagnosis not present

## 2018-05-09 DIAGNOSIS — R51 Headache: Secondary | ICD-10-CM | POA: Diagnosis not present

## 2018-05-09 DIAGNOSIS — Z299 Encounter for prophylactic measures, unspecified: Secondary | ICD-10-CM | POA: Diagnosis not present

## 2018-05-09 DIAGNOSIS — Z6825 Body mass index (BMI) 25.0-25.9, adult: Secondary | ICD-10-CM | POA: Diagnosis not present

## 2018-05-13 DIAGNOSIS — E1165 Type 2 diabetes mellitus with hyperglycemia: Secondary | ICD-10-CM | POA: Diagnosis not present

## 2018-05-13 DIAGNOSIS — Z299 Encounter for prophylactic measures, unspecified: Secondary | ICD-10-CM | POA: Diagnosis not present

## 2018-05-13 DIAGNOSIS — R6889 Other general symptoms and signs: Secondary | ICD-10-CM | POA: Diagnosis not present

## 2018-05-13 DIAGNOSIS — Z6825 Body mass index (BMI) 25.0-25.9, adult: Secondary | ICD-10-CM | POA: Diagnosis not present

## 2018-05-13 DIAGNOSIS — I639 Cerebral infarction, unspecified: Secondary | ICD-10-CM | POA: Diagnosis not present

## 2018-05-13 DIAGNOSIS — I10 Essential (primary) hypertension: Secondary | ICD-10-CM | POA: Diagnosis not present

## 2018-05-26 DIAGNOSIS — R6889 Other general symptoms and signs: Secondary | ICD-10-CM | POA: Diagnosis not present

## 2018-05-26 DIAGNOSIS — R079 Chest pain, unspecified: Secondary | ICD-10-CM | POA: Diagnosis not present

## 2018-06-06 ENCOUNTER — Encounter: Payer: Self-pay | Admitting: Diagnostic Neuroimaging

## 2018-06-07 ENCOUNTER — Ambulatory Visit: Payer: Medicare HMO | Admitting: Diagnostic Neuroimaging

## 2018-06-07 ENCOUNTER — Telehealth: Payer: Self-pay | Admitting: *Deleted

## 2018-06-07 NOTE — Telephone Encounter (Signed)
Patient called today and cancelled his new patient appointment, stated "the bus broke down".

## 2018-06-08 ENCOUNTER — Encounter: Payer: Self-pay | Admitting: Diagnostic Neuroimaging

## 2018-07-25 ENCOUNTER — Ambulatory Visit: Payer: Medicare HMO | Admitting: Diagnostic Neuroimaging

## 2018-08-30 DIAGNOSIS — Z6826 Body mass index (BMI) 26.0-26.9, adult: Secondary | ICD-10-CM | POA: Diagnosis not present

## 2018-08-30 DIAGNOSIS — I639 Cerebral infarction, unspecified: Secondary | ICD-10-CM | POA: Diagnosis not present

## 2018-08-30 DIAGNOSIS — I10 Essential (primary) hypertension: Secondary | ICD-10-CM | POA: Diagnosis not present

## 2018-08-30 DIAGNOSIS — Z299 Encounter for prophylactic measures, unspecified: Secondary | ICD-10-CM | POA: Diagnosis not present

## 2018-08-30 DIAGNOSIS — E785 Hyperlipidemia, unspecified: Secondary | ICD-10-CM | POA: Diagnosis not present

## 2018-08-30 DIAGNOSIS — E1165 Type 2 diabetes mellitus with hyperglycemia: Secondary | ICD-10-CM | POA: Diagnosis not present

## 2018-09-27 ENCOUNTER — Telehealth: Payer: Self-pay | Admitting: *Deleted

## 2018-09-27 NOTE — Telephone Encounter (Signed)
Called the only # for patient x 2, recording stated it was invalid number. Called sister, Dewayne Hatch; her home number no longer in service. I called her mobile #, and the male that answered stated I had the wrong number.  Trying to reach patient to reschedule his Fri appt; office is closed on Fridays.

## 2018-10-11 NOTE — Telephone Encounter (Addendum)
Called patient's mobile #, no answer and  voice mailbox not set up. Called sister, home # not valid. Called her mobile and LVM advising her that I have been unable to reach patient on multiple attempts. I advised her that due to current COVID 19 pandemic, our office is severely reducing in person visits in order to minimize the risk to our patients and healthcare providers. We recommend to convert his appointment to a video visit. Also advised her we are closed on Fridays, so his appt must be rescheduled. He can be rescheduled for July to come into office if necessary.  Left number for call back.

## 2018-10-14 ENCOUNTER — Ambulatory Visit: Payer: Medicare HMO | Admitting: Diagnostic Neuroimaging

## 2018-11-23 ENCOUNTER — Ambulatory Visit (INDEPENDENT_AMBULATORY_CARE_PROVIDER_SITE_OTHER): Payer: Medicare HMO | Admitting: Diagnostic Neuroimaging

## 2018-11-23 ENCOUNTER — Encounter: Payer: Self-pay | Admitting: Diagnostic Neuroimaging

## 2018-11-23 ENCOUNTER — Other Ambulatory Visit: Payer: Self-pay

## 2018-11-23 VITALS — BP 141/91 | HR 64 | Temp 98.0°F | Ht 70.5 in | Wt 174.2 lb

## 2018-11-23 DIAGNOSIS — R200 Anesthesia of skin: Secondary | ICD-10-CM | POA: Diagnosis not present

## 2018-11-23 DIAGNOSIS — R29898 Other symptoms and signs involving the musculoskeletal system: Secondary | ICD-10-CM | POA: Diagnosis not present

## 2018-11-23 NOTE — Patient Instructions (Signed)
LEFT ARM NUMBNESS AND WEAKNESS (since 2008; after effects stroke vs pinched nerve in neck vs carpal tunnel syndrome) - recommend conservative mgmt; consider OT evaluation  STROKE PREVENTION - continue aspirin, BP control, statin  ESSENTIAL TREMOR - may consider beta-blocker per PCP or cardiology if needed for tremor control

## 2018-11-23 NOTE — Progress Notes (Signed)
[facility]  PATIENT: Albert [name] DOB: 06/01/1945  REFERRING CLINICIAN: Kerrie PleasureA Boone, NP HISTORY FROM: patient  REASON FOR VISIT: new consult    HISTORICAL  CHIEF COMPLAINT:  Chief Complaint  Patient presents with  . Cervical spinal stenosis    rm 7 New Pt    HISTORY OF PRESENT ILLNESS:   73 year old male here for evaluation of left arm numbness and weakness.  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  Ever since that time he has had numbness and weakness in his left arm.  Patient went to the hospital in January 2020 for headache and left arm numbness and weakness, had CT of the head and cervical spine and was referred back to PCP.  Patient was then referred here for evaluation of "cervical spinal stenosis" although this is not mentioned on the CT cervical spine report.  Patient also reports bilateral upper extremity tremor, starting in his 2220s, which has gradually worsened over time.   REVIEW OF SYSTEMS: Full 14 system review of systems performed and negative with exception of: As per HPI.  Chest pain easy bruising easy bleeding blood in stool.  Hypertension diabetes depression anxiety.  ALLERGIES: Allergies  Allergen Reactions  . Ciprofloxacin Other (See Comments)  . Diovan [Valsartan] Nausea And Vomiting  . Hydrocodone Nausea And Vomiting    Chest discomfort.  . Penicillins     Chest pain    HOME MEDICATIONS: Outpatient Medications Prior to Visit  Medication Sig Dispense Refill  . aspirin 81 MG tablet Take 160 mg by mouth 2 (two) times daily.     Marland Kitchen. lisinopril (PRINIVIL,ZESTRIL) 10 MG tablet TAKE 1 TABLET EVERY DAY  (DOSE INCREASE 10/6 AND DISCONTINUE ATENOLOL) 90 tablet 1  . Multiple Vitamins-Minerals (MULTIVITAMIN ADULT PO) Take 1 tablet by mouth daily.    . ondansetron (ZOFRAN) 4 MG tablet 4 mg as needed.    . pantoprazole (PROTONIX) 40 MG tablet 40 mg daily.    . pravastatin (PRAVACHOL) 40 MG tablet Take 1 tablet (40 mg  total) by mouth daily. 90 tablet 1  . sertraline (ZOLOFT) 100 MG tablet Take 2 tablets (200 mg total) by mouth daily. 180 tablet 0  . temazepam (RESTORIL) 15 MG capsule 15 mg daily.    . TYLENOL 500 MG tablet Take 1 tablet by mouth 2 (two) times daily.    Marland Kitchen. amLODipine (NORVASC) 10 MG tablet Take 1 tablet (10 mg total) by mouth daily. 90 tablet 1  . LORazepam (ATIVAN) 0.5 MG tablet Take 1 tablet (0.5 mg total) by mouth 2 (two) times daily. (Patient not taking: Reported on 11/23/2018) 60 tablet 2  . Misc. Devices (QUAD CANE) MISC Use to balance and to prevent falls (Dx R29.6 - high fall risk) (Patient not taking: Reported on 11/23/2018) 1 each 0  . omeprazole (PRILOSEC) 20 MG capsule TAKE 1 CAPSULE TWICE DAILY BEFORE MEALS 180 capsule 0   No facility-administered medications prior to visit.     PAST MEDICAL HISTORY: Past Medical History:  Diagnosis Date  . Anxiety   . Depression   . Diabetes (HCC)    type 2  . Dizziness   . GERD (gastroesophageal reflux disease)   . Heart murmur   . Hyperlipidemia   . Hypertension   . Myocardial infarction (HCC) 2008  . Neuropathy    left foot  . Spinal stenosis   . Stroke [facility](HCC) 2008    PAST SURGICAL HISTORY: Past Surgical History:  Procedure Laterality Date  .  APPENDECTOMY  1980    FAMILY HISTORY: Family History  Problem Relation Age of Onset  . Heart disease Mother   . Hypertension Mother   . Heart attack Mother   . Cancer Mother   . Heart disease Father   . Heart attack Father   . Hypertension Brother   . Heart disease Brother   . Heart attack Sister   . Heart attack Sister     SOCIAL HISTORY: Social History   Socioeconomic History  . Marital status: Widowed    Spouse name: Not on file  . Number of children: 3  . Years of education: 8  . Highest education level: Not on file  Occupational History  . Not on file  Social Needs  . Financial resource strain: Not on file  . Food insecurity    Worry: Not on file     Inability: Not on file  . Transportation needs    Medical: Not on file    Non-medical: Not on file  Tobacco Use  . Smoking status: Never Smoker  . Smokeless tobacco: Never Used  Substance and Sexual Activity  . Alcohol use: No    Alcohol/week: 0.0 standard drinks  . Drug use: No  . Sexual activity: Never  Lifestyle  . Physical activity    Days per week: Not on file    Minutes per session: Not on file  . Stress: Not on file  Relationships  . Social Musicianconnections    Talks on phone: Not on file    Gets together: Not on file    Attends religious service: Not on file    Active member of club or organization: Not on file    Attends meetings of clubs or organizations: Not on file    Relationship status: Not on file  . Intimate partner violence    Fear of current or ex partner: Not on file    Emotionally abused: Not on file    Physically abused: Not on file    Forced sexual activity: Not on file  Other Topics Concern  . Not on file  Social History Narrative   Lives alone with dog   Caffeine- coffee 1 daily, occas Dr Reino KentPepper     PHYSICAL EXAM  GENERAL EXAM/CONSTITUTIONAL: Vitals:  Vitals:   11/23/18 0820  BP: (!) 141/91  Pulse: 64  Temp: 98 F (36.7 C)  Weight: 174 lb 3.2 oz (79 kg)  Height: 5' 10.5" (1.791 m)     Body mass index is 24.64 kg/m. Wt Readings from Last 3 Encounters:  11/23/18 174 lb 3.2 oz (79 kg)  10/08/15 180 lb 3.2 oz (81.7 kg)  04/23/15 186 lb (84.4 kg)     Patient is in no distress; well developed, nourished and groomed; neck is supple  CARDIOVASCULAR:  Examination of carotid arteries is normal; no carotid bruits  Regular rate and rhythm, no murmurs  Examination of peripheral vascular system by observation and palpation is normal  EYES:  Ophthalmoscopic exam of optic discs and posterior segments is normal; no papilledema or hemorrhages  No exam data present  MUSCULOSKELETAL:  Gait, strength, tone, movements noted in Neurologic  exam below  NEUROLOGIC: MENTAL STATUS:  MMSE - Mini Mental State Exam 01/25/2015  Orientation to time 5  Orientation to Place 5  Registration 3  Attention/ Calculation 5  Recall 3  Language- name 2 objects 2  Language- repeat 1  Language- follow 3 step command 3  Language- read & follow direction 1  Write a sentence 1  Copy design 0  Total score 29    awake, alert, oriented to person, place and time  recent and remote memory intact  normal attention and concentration  language fluent, comprehension intact, naming intact  fund of knowledge appropriate  CRANIAL NERVE:   2nd - no papilledema on fundoscopic exam  2nd, 3rd, 4th, 6th - pupils equal and reactive to light, visual fields full to confrontation, extraocular muscles intact, no nystagmus  5th - facial sensation symmetric  7th - facial strength symmetric  8th - hearing intact  9th - palate elevates symmetrically, uvula midline  11th - shoulder shrug symmetric  12th - tongue protrusion midline  MOTOR:   POSTURAL TREMOR IN BUE  normal bulk and tone, full strength in the BUE, BLE; EXCEPT SLIGHTLY DECR LEFT UPPER EXT (4/5)  SENSORY:   normal and symmetric to light touch, temperature, vibration; EXCEPT DECR IN LEFT UPPER EXT  COORDINATION:   finger-nose-finger, fine finger movements --> SLOW IN LEFT HAND   REFLEXES:   deep tendon reflexes 1+ and symmetric  GAIT/STATION:   narrow based gait     DIAGNOSTIC DATA (LABS, IMAGING, TESTING) - I reviewed patient records, labs, notes, testing and imaging myself where available.  Lab Results  Component Value Date   WBC 7.1 02/01/2015   HGB 14.6 02/01/2015   HCT 43.6 02/01/2015   MCV 89 02/01/2015   PLT 250 02/01/2015      Component Value Date/Time   NA 144 04/23/2015 1429   K 4.6 04/23/2015 1429   CL 101 04/23/2015 1429   CO2 25 04/23/2015 1429   GLUCOSE 88 04/23/2015 1429   GLUCOSE 165 (H) 02/13/2014 0841   BUN 9 04/23/2015 1429    CREATININE 0.79 04/23/2015 1429   CREATININE 0.84 02/13/2014 0841   CALCIUM 9.1 04/23/2015 1429   PROT 7.3 04/23/2015 1429   ALBUMIN 4.6 04/23/2015 1429   AST 33 04/23/2015 1429   ALT 36 04/23/2015 1429   ALKPHOS 96 04/23/2015 1429   BILITOT 0.4 04/23/2015 1429   GFRNONAA 93 04/23/2015 1429   GFRAA 107 04/23/2015 1429   Lab Results  Component Value Date   CHOL 158 04/23/2015   HDL 65 04/23/2015   LDLCALC 70 04/23/2015   TRIG 113 04/23/2015   CHOLHDL 2.4 04/23/2015   Lab Results  Component Value Date   HGBA1C  10/14/2006    6.1 (NOTE)   The ADA recommends the following therapeutic goals for glycemic   control related to Hgb A1C measurement:   Goal of Therapy:   < 7.0% Hgb A1C   Action Suggested:  > 8.0% Hgb A1C   Ref:  Diabetes Care, 22, Suppl. 1, 1999   Lab Results  Component Value Date   VITAMINB12 234 01/25/2015   Lab Results  Component Value Date   TSH 2.088 02/13/2014    05/08/18 CT BRAIN: [I reviewed images myself and agree with interpretation. -VRP]  1. No acute intracranial abnormality. 2. Generalized age-related cerebral atrophy with advanced chronic microvascular ischemic disease, stable.  05/08/18 CT CERVICAL SPINE: [I reviewed images myself and agree with interpretation. -VRP]  1. No acute abnormality within the cervical spine. 2. 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  11/08/06 MRI brain - Stable chronic small vessel disease.  No acute intracranial abnormality.    11/08/06 MRA head  - Stable, unremarkable study.  11/08/06 MRA neck - Normal aside from tortuosity and anatomic  variants including dominant left vertebral artery.      ASSESSMENT AND PLAN  73 y.o. year old male here with:  Dx:  1. Left arm weakness   2. Left arm numbness     PLAN:  LEFT ARM NUMBNESS AND WEAKNESS (since 2008; post stroke sequelae (although MRI in 2008 unremarkable) vs cervical radiculopathy vs  peripheral neuropathy / CTS) - recommend conservative mgmt; consider OT evaluation  STROKE PREVENTION - aspirin, BP control, statin  ESSENTIAL TREMOR - may consider beta-blocker per PCP or cardiology if needed for tremor control  Return for return to PCP, pending if symptoms worsen or fail to improve.    Penni Bombard, MD 7/74/1287, 8:67 AM Certified in Neurology, Neurophysiology and Neuroimaging  [facility] [address] [place], [place] [address] [phone]

## 2018-12-05 DIAGNOSIS — I10 Essential (primary) hypertension: Secondary | ICD-10-CM | POA: Diagnosis not present

## 2018-12-05 DIAGNOSIS — Z6826 Body mass index (BMI) 26.0-26.9, adult: Secondary | ICD-10-CM | POA: Diagnosis not present

## 2018-12-05 DIAGNOSIS — E785 Hyperlipidemia, unspecified: Secondary | ICD-10-CM | POA: Diagnosis not present

## 2018-12-05 DIAGNOSIS — Z299 Encounter for prophylactic measures, unspecified: Secondary | ICD-10-CM | POA: Diagnosis not present

## 2018-12-05 DIAGNOSIS — E1165 Type 2 diabetes mellitus with hyperglycemia: Secondary | ICD-10-CM | POA: Diagnosis not present

## 2019-01-31 DIAGNOSIS — I10 Essential (primary) hypertension: Secondary | ICD-10-CM | POA: Diagnosis not present

## 2019-01-31 DIAGNOSIS — Z789 Other specified health status: Secondary | ICD-10-CM | POA: Diagnosis not present

## 2019-01-31 DIAGNOSIS — Z6825 Body mass index (BMI) 25.0-25.9, adult: Secondary | ICD-10-CM | POA: Diagnosis not present

## 2019-01-31 DIAGNOSIS — H9202 Otalgia, left ear: Secondary | ICD-10-CM | POA: Diagnosis not present

## 2019-01-31 DIAGNOSIS — E1165 Type 2 diabetes mellitus with hyperglycemia: Secondary | ICD-10-CM | POA: Diagnosis not present

## 2019-01-31 DIAGNOSIS — R519 Headache, unspecified: Secondary | ICD-10-CM | POA: Diagnosis not present

## 2019-01-31 DIAGNOSIS — Z299 Encounter for prophylactic measures, unspecified: Secondary | ICD-10-CM | POA: Diagnosis not present

## 2019-02-06 DIAGNOSIS — I10 Essential (primary) hypertension: Secondary | ICD-10-CM | POA: Diagnosis not present

## 2019-02-06 DIAGNOSIS — H6123 Impacted cerumen, bilateral: Secondary | ICD-10-CM | POA: Diagnosis not present

## 2019-02-06 DIAGNOSIS — Z299 Encounter for prophylactic measures, unspecified: Secondary | ICD-10-CM | POA: Diagnosis not present

## 2019-02-06 DIAGNOSIS — Z6825 Body mass index (BMI) 25.0-25.9, adult: Secondary | ICD-10-CM | POA: Diagnosis not present

## 2019-03-02 ENCOUNTER — Other Ambulatory Visit: Payer: Self-pay

## 2019-03-02 DIAGNOSIS — Z20828 Contact with and (suspected) exposure to other viral communicable diseases: Secondary | ICD-10-CM | POA: Diagnosis not present

## 2019-03-02 DIAGNOSIS — Z20822 Contact with and (suspected) exposure to covid-19: Secondary | ICD-10-CM

## 2019-03-03 LAB — NOVEL CORONAVIRUS, NAA: SARS-CoV-2, NAA: NOT DETECTED

## 2019-03-13 DIAGNOSIS — Z6825 Body mass index (BMI) 25.0-25.9, adult: Secondary | ICD-10-CM | POA: Diagnosis not present

## 2019-03-13 DIAGNOSIS — E1165 Type 2 diabetes mellitus with hyperglycemia: Secondary | ICD-10-CM | POA: Diagnosis not present

## 2019-03-13 DIAGNOSIS — Z299 Encounter for prophylactic measures, unspecified: Secondary | ICD-10-CM | POA: Diagnosis not present

## 2019-03-13 DIAGNOSIS — I10 Essential (primary) hypertension: Secondary | ICD-10-CM | POA: Diagnosis not present

## 2019-03-13 DIAGNOSIS — G4700 Insomnia, unspecified: Secondary | ICD-10-CM | POA: Diagnosis not present

## 2019-03-13 DIAGNOSIS — I639 Cerebral infarction, unspecified: Secondary | ICD-10-CM | POA: Diagnosis not present

## 2019-04-27 DIAGNOSIS — Z299 Encounter for prophylactic measures, unspecified: Secondary | ICD-10-CM | POA: Diagnosis not present

## 2019-04-27 DIAGNOSIS — Z1331 Encounter for screening for depression: Secondary | ICD-10-CM | POA: Diagnosis not present

## 2019-04-27 DIAGNOSIS — Z79899 Other long term (current) drug therapy: Secondary | ICD-10-CM | POA: Diagnosis not present

## 2019-04-27 DIAGNOSIS — E785 Hyperlipidemia, unspecified: Secondary | ICD-10-CM | POA: Diagnosis not present

## 2019-04-27 DIAGNOSIS — Z1339 Encounter for screening examination for other mental health and behavioral disorders: Secondary | ICD-10-CM | POA: Diagnosis not present

## 2019-04-27 DIAGNOSIS — I10 Essential (primary) hypertension: Secondary | ICD-10-CM | POA: Diagnosis not present

## 2019-04-27 DIAGNOSIS — Z7189 Other specified counseling: Secondary | ICD-10-CM | POA: Diagnosis not present

## 2019-04-27 DIAGNOSIS — Z1211 Encounter for screening for malignant neoplasm of colon: Secondary | ICD-10-CM | POA: Diagnosis not present

## 2019-04-27 DIAGNOSIS — Z0000 Encounter for general adult medical examination without abnormal findings: Secondary | ICD-10-CM | POA: Diagnosis not present

## 2019-04-27 DIAGNOSIS — Z125 Encounter for screening for malignant neoplasm of prostate: Secondary | ICD-10-CM | POA: Diagnosis not present

## 2019-04-27 DIAGNOSIS — F419 Anxiety disorder, unspecified: Secondary | ICD-10-CM | POA: Diagnosis not present

## 2019-04-27 DIAGNOSIS — Z6826 Body mass index (BMI) 26.0-26.9, adult: Secondary | ICD-10-CM | POA: Diagnosis not present

## 2019-05-19 DIAGNOSIS — Z23 Encounter for immunization: Secondary | ICD-10-CM | POA: Diagnosis not present

## 2019-06-19 DIAGNOSIS — R251 Tremor, unspecified: Secondary | ICD-10-CM | POA: Diagnosis not present

## 2019-06-19 DIAGNOSIS — I10 Essential (primary) hypertension: Secondary | ICD-10-CM | POA: Diagnosis not present

## 2019-06-19 DIAGNOSIS — Z299 Encounter for prophylactic measures, unspecified: Secondary | ICD-10-CM | POA: Diagnosis not present

## 2019-06-19 DIAGNOSIS — G4700 Insomnia, unspecified: Secondary | ICD-10-CM | POA: Diagnosis not present

## 2019-06-19 DIAGNOSIS — E1165 Type 2 diabetes mellitus with hyperglycemia: Secondary | ICD-10-CM | POA: Diagnosis not present

## 2019-07-25 DIAGNOSIS — G3184 Mild cognitive impairment, so stated: Secondary | ICD-10-CM | POA: Diagnosis not present

## 2019-07-25 DIAGNOSIS — I639 Cerebral infarction, unspecified: Secondary | ICD-10-CM | POA: Diagnosis not present

## 2019-07-25 DIAGNOSIS — R001 Bradycardia, unspecified: Secondary | ICD-10-CM | POA: Diagnosis not present

## 2019-07-25 DIAGNOSIS — R296 Repeated falls: Secondary | ICD-10-CM | POA: Diagnosis not present

## 2019-07-25 DIAGNOSIS — G250 Essential tremor: Secondary | ICD-10-CM | POA: Diagnosis not present

## 2019-08-23 DIAGNOSIS — R296 Repeated falls: Secondary | ICD-10-CM | POA: Diagnosis not present

## 2019-08-23 DIAGNOSIS — G250 Essential tremor: Secondary | ICD-10-CM | POA: Diagnosis not present

## 2019-08-23 DIAGNOSIS — Z79899 Other long term (current) drug therapy: Secondary | ICD-10-CM | POA: Diagnosis not present

## 2019-08-23 DIAGNOSIS — R413 Other amnesia: Secondary | ICD-10-CM | POA: Diagnosis not present

## 2019-08-23 DIAGNOSIS — R001 Bradycardia, unspecified: Secondary | ICD-10-CM | POA: Diagnosis not present

## 2019-09-18 DIAGNOSIS — Z8673 Personal history of transient ischemic attack (TIA), and cerebral infarction without residual deficits: Secondary | ICD-10-CM | POA: Diagnosis not present

## 2019-09-18 DIAGNOSIS — I10 Essential (primary) hypertension: Secondary | ICD-10-CM | POA: Diagnosis not present

## 2019-09-18 DIAGNOSIS — Z299 Encounter for prophylactic measures, unspecified: Secondary | ICD-10-CM | POA: Diagnosis not present

## 2019-09-18 DIAGNOSIS — E1165 Type 2 diabetes mellitus with hyperglycemia: Secondary | ICD-10-CM | POA: Diagnosis not present

## 2019-12-19 DIAGNOSIS — E1165 Type 2 diabetes mellitus with hyperglycemia: Secondary | ICD-10-CM | POA: Diagnosis not present

## 2019-12-19 DIAGNOSIS — Z299 Encounter for prophylactic measures, unspecified: Secondary | ICD-10-CM | POA: Diagnosis not present

## 2019-12-19 DIAGNOSIS — I10 Essential (primary) hypertension: Secondary | ICD-10-CM | POA: Diagnosis not present

## 2020-01-02 DIAGNOSIS — E1165 Type 2 diabetes mellitus with hyperglycemia: Secondary | ICD-10-CM | POA: Diagnosis not present

## 2020-01-02 DIAGNOSIS — Z789 Other specified health status: Secondary | ICD-10-CM | POA: Diagnosis not present

## 2020-01-02 DIAGNOSIS — Z299 Encounter for prophylactic measures, unspecified: Secondary | ICD-10-CM | POA: Diagnosis not present

## 2020-01-02 DIAGNOSIS — I10 Essential (primary) hypertension: Secondary | ICD-10-CM | POA: Diagnosis not present

## 2020-03-04 DIAGNOSIS — I10 Essential (primary) hypertension: Secondary | ICD-10-CM | POA: Diagnosis not present

## 2020-03-04 DIAGNOSIS — Z299 Encounter for prophylactic measures, unspecified: Secondary | ICD-10-CM | POA: Diagnosis not present

## 2020-03-04 DIAGNOSIS — M25562 Pain in left knee: Secondary | ICD-10-CM | POA: Diagnosis not present

## 2020-03-04 DIAGNOSIS — E1165 Type 2 diabetes mellitus with hyperglycemia: Secondary | ICD-10-CM | POA: Diagnosis not present

## 2020-03-11 DIAGNOSIS — I10 Essential (primary) hypertension: Secondary | ICD-10-CM | POA: Diagnosis not present

## 2020-03-11 DIAGNOSIS — Z23 Encounter for immunization: Secondary | ICD-10-CM | POA: Diagnosis not present

## 2020-03-11 DIAGNOSIS — Z299 Encounter for prophylactic measures, unspecified: Secondary | ICD-10-CM | POA: Diagnosis not present

## 2020-03-11 DIAGNOSIS — S82009A Unspecified fracture of unspecified patella, initial encounter for closed fracture: Secondary | ICD-10-CM | POA: Diagnosis not present

## 2020-03-11 DIAGNOSIS — E1165 Type 2 diabetes mellitus with hyperglycemia: Secondary | ICD-10-CM | POA: Diagnosis not present

## 2020-03-13 DIAGNOSIS — S82092D Other fracture of left patella, subsequent encounter for closed fracture with routine healing: Secondary | ICD-10-CM | POA: Diagnosis not present

## 2020-03-13 DIAGNOSIS — M7122 Synovial cyst of popliteal space [Baker], left knee: Secondary | ICD-10-CM | POA: Diagnosis not present

## 2020-03-13 DIAGNOSIS — M1712 Unilateral primary osteoarthritis, left knee: Secondary | ICD-10-CM | POA: Diagnosis not present

## 2020-03-13 DIAGNOSIS — R600 Localized edema: Secondary | ICD-10-CM | POA: Diagnosis not present

## 2020-04-25 DIAGNOSIS — Z789 Other specified health status: Secondary | ICD-10-CM | POA: Diagnosis not present

## 2020-04-25 DIAGNOSIS — R059 Cough, unspecified: Secondary | ICD-10-CM | POA: Diagnosis not present

## 2020-04-25 DIAGNOSIS — Z299 Encounter for prophylactic measures, unspecified: Secondary | ICD-10-CM | POA: Diagnosis not present

## 2020-04-25 DIAGNOSIS — J069 Acute upper respiratory infection, unspecified: Secondary | ICD-10-CM | POA: Diagnosis not present

## 2020-05-03 DIAGNOSIS — Z789 Other specified health status: Secondary | ICD-10-CM | POA: Diagnosis not present

## 2020-05-03 DIAGNOSIS — Z299 Encounter for prophylactic measures, unspecified: Secondary | ICD-10-CM | POA: Diagnosis not present

## 2020-05-03 DIAGNOSIS — F32A Depression, unspecified: Secondary | ICD-10-CM | POA: Diagnosis not present

## 2020-05-03 DIAGNOSIS — I10 Essential (primary) hypertension: Secondary | ICD-10-CM | POA: Diagnosis not present

## 2020-05-03 DIAGNOSIS — U071 COVID-19: Secondary | ICD-10-CM | POA: Diagnosis not present

## 2020-05-03 DIAGNOSIS — F325 Major depressive disorder, single episode, in full remission: Secondary | ICD-10-CM | POA: Diagnosis not present

## 2020-05-07 DIAGNOSIS — U071 COVID-19: Secondary | ICD-10-CM | POA: Diagnosis not present

## 2020-05-07 DIAGNOSIS — Z789 Other specified health status: Secondary | ICD-10-CM | POA: Diagnosis not present

## 2020-05-07 DIAGNOSIS — Z299 Encounter for prophylactic measures, unspecified: Secondary | ICD-10-CM | POA: Diagnosis not present

## 2020-05-07 DIAGNOSIS — F419 Anxiety disorder, unspecified: Secondary | ICD-10-CM | POA: Diagnosis not present

## 2020-05-07 DIAGNOSIS — I10 Essential (primary) hypertension: Secondary | ICD-10-CM | POA: Diagnosis not present

## 2020-05-13 DIAGNOSIS — K449 Diaphragmatic hernia without obstruction or gangrene: Secondary | ICD-10-CM | POA: Diagnosis not present

## 2020-05-13 DIAGNOSIS — R079 Chest pain, unspecified: Secondary | ICD-10-CM | POA: Diagnosis not present

## 2020-05-13 DIAGNOSIS — I700 Atherosclerosis of aorta: Secondary | ICD-10-CM | POA: Diagnosis not present

## 2020-05-13 DIAGNOSIS — R0602 Shortness of breath: Secondary | ICD-10-CM | POA: Diagnosis not present

## 2020-05-13 DIAGNOSIS — E860 Dehydration: Secondary | ICD-10-CM | POA: Diagnosis not present

## 2020-05-13 DIAGNOSIS — R197 Diarrhea, unspecified: Secondary | ICD-10-CM | POA: Diagnosis not present

## 2020-05-13 DIAGNOSIS — U071 COVID-19: Secondary | ICD-10-CM | POA: Diagnosis not present

## 2020-05-13 DIAGNOSIS — R059 Cough, unspecified: Secondary | ICD-10-CM | POA: Diagnosis not present

## 2020-06-05 DIAGNOSIS — S0501XA Injury of conjunctiva and corneal abrasion without foreign body, right eye, initial encounter: Secondary | ICD-10-CM | POA: Diagnosis not present

## 2020-09-18 DIAGNOSIS — E1165 Type 2 diabetes mellitus with hyperglycemia: Secondary | ICD-10-CM | POA: Diagnosis not present

## 2020-09-18 DIAGNOSIS — K449 Diaphragmatic hernia without obstruction or gangrene: Secondary | ICD-10-CM | POA: Diagnosis not present

## 2020-09-18 DIAGNOSIS — Z299 Encounter for prophylactic measures, unspecified: Secondary | ICD-10-CM | POA: Diagnosis not present

## 2020-09-18 DIAGNOSIS — Z789 Other specified health status: Secondary | ICD-10-CM | POA: Diagnosis not present

## 2020-09-18 DIAGNOSIS — R531 Weakness: Secondary | ICD-10-CM | POA: Diagnosis not present

## 2020-09-18 DIAGNOSIS — I10 Essential (primary) hypertension: Secondary | ICD-10-CM | POA: Diagnosis not present

## 2020-09-18 DIAGNOSIS — E860 Dehydration: Secondary | ICD-10-CM | POA: Diagnosis not present

## 2020-09-18 DIAGNOSIS — R059 Cough, unspecified: Secondary | ICD-10-CM | POA: Diagnosis not present

## 2020-09-18 DIAGNOSIS — I959 Hypotension, unspecified: Secondary | ICD-10-CM | POA: Diagnosis not present

## 2020-09-25 DIAGNOSIS — Z299 Encounter for prophylactic measures, unspecified: Secondary | ICD-10-CM | POA: Diagnosis not present

## 2020-09-25 DIAGNOSIS — Z79899 Other long term (current) drug therapy: Secondary | ICD-10-CM | POA: Diagnosis not present

## 2020-09-25 DIAGNOSIS — Z7189 Other specified counseling: Secondary | ICD-10-CM | POA: Diagnosis not present

## 2020-09-25 DIAGNOSIS — R5383 Other fatigue: Secondary | ICD-10-CM | POA: Diagnosis not present

## 2020-09-25 DIAGNOSIS — Z0000 Encounter for general adult medical examination without abnormal findings: Secondary | ICD-10-CM | POA: Diagnosis not present

## 2020-09-25 DIAGNOSIS — E1165 Type 2 diabetes mellitus with hyperglycemia: Secondary | ICD-10-CM | POA: Diagnosis not present

## 2020-09-25 DIAGNOSIS — E1140 Type 2 diabetes mellitus with diabetic neuropathy, unspecified: Secondary | ICD-10-CM | POA: Diagnosis not present

## 2020-09-25 DIAGNOSIS — E119 Type 2 diabetes mellitus without complications: Secondary | ICD-10-CM | POA: Diagnosis not present

## 2020-09-25 DIAGNOSIS — E7800 Pure hypercholesterolemia, unspecified: Secondary | ICD-10-CM | POA: Diagnosis not present

## 2020-10-17 DIAGNOSIS — I10 Essential (primary) hypertension: Secondary | ICD-10-CM | POA: Diagnosis not present

## 2020-10-17 DIAGNOSIS — K2100 Gastro-esophageal reflux disease with esophagitis, without bleeding: Secondary | ICD-10-CM | POA: Diagnosis not present

## 2020-10-17 DIAGNOSIS — Z299 Encounter for prophylactic measures, unspecified: Secondary | ICD-10-CM | POA: Diagnosis not present

## 2020-10-22 DIAGNOSIS — E1165 Type 2 diabetes mellitus with hyperglycemia: Secondary | ICD-10-CM | POA: Diagnosis not present

## 2020-10-22 DIAGNOSIS — Z789 Other specified health status: Secondary | ICD-10-CM | POA: Diagnosis not present

## 2020-10-22 DIAGNOSIS — K2970 Gastritis, unspecified, without bleeding: Secondary | ICD-10-CM | POA: Diagnosis not present

## 2020-10-22 DIAGNOSIS — Z299 Encounter for prophylactic measures, unspecified: Secondary | ICD-10-CM | POA: Diagnosis not present

## 2020-10-22 DIAGNOSIS — I959 Hypotension, unspecified: Secondary | ICD-10-CM | POA: Diagnosis not present

## 2020-10-22 DIAGNOSIS — I10 Essential (primary) hypertension: Secondary | ICD-10-CM | POA: Diagnosis not present

## 2020-12-19 DIAGNOSIS — I10 Essential (primary) hypertension: Secondary | ICD-10-CM | POA: Diagnosis not present

## 2020-12-19 DIAGNOSIS — R079 Chest pain, unspecified: Secondary | ICD-10-CM | POA: Diagnosis not present

## 2020-12-31 DIAGNOSIS — F325 Major depressive disorder, single episode, in full remission: Secondary | ICD-10-CM | POA: Diagnosis not present

## 2020-12-31 DIAGNOSIS — E785 Hyperlipidemia, unspecified: Secondary | ICD-10-CM | POA: Diagnosis not present

## 2020-12-31 DIAGNOSIS — Z789 Other specified health status: Secondary | ICD-10-CM | POA: Diagnosis not present

## 2020-12-31 DIAGNOSIS — E1165 Type 2 diabetes mellitus with hyperglycemia: Secondary | ICD-10-CM | POA: Diagnosis not present

## 2020-12-31 DIAGNOSIS — I10 Essential (primary) hypertension: Secondary | ICD-10-CM | POA: Diagnosis not present

## 2020-12-31 DIAGNOSIS — Z6826 Body mass index (BMI) 26.0-26.9, adult: Secondary | ICD-10-CM | POA: Diagnosis not present

## 2020-12-31 DIAGNOSIS — Z299 Encounter for prophylactic measures, unspecified: Secondary | ICD-10-CM | POA: Diagnosis not present

## 2021-01-07 DIAGNOSIS — Z23 Encounter for immunization: Secondary | ICD-10-CM | POA: Diagnosis not present

## 2021-01-17 DIAGNOSIS — Z743 Need for continuous supervision: Secondary | ICD-10-CM | POA: Diagnosis not present

## 2021-01-17 DIAGNOSIS — Z885 Allergy status to narcotic agent status: Secondary | ICD-10-CM | POA: Diagnosis not present

## 2021-01-17 DIAGNOSIS — R079 Chest pain, unspecified: Secondary | ICD-10-CM | POA: Diagnosis not present

## 2021-01-17 DIAGNOSIS — I10 Essential (primary) hypertension: Secondary | ICD-10-CM | POA: Diagnosis not present

## 2021-01-17 DIAGNOSIS — Q401 Congenital hiatus hernia: Secondary | ICD-10-CM | POA: Diagnosis not present

## 2021-01-17 DIAGNOSIS — Z7982 Long term (current) use of aspirin: Secondary | ICD-10-CM | POA: Diagnosis not present

## 2021-01-17 DIAGNOSIS — R001 Bradycardia, unspecified: Secondary | ICD-10-CM | POA: Diagnosis not present

## 2021-01-17 DIAGNOSIS — K449 Diaphragmatic hernia without obstruction or gangrene: Secondary | ICD-10-CM | POA: Diagnosis not present

## 2021-01-17 DIAGNOSIS — K5730 Diverticulosis of large intestine without perforation or abscess without bleeding: Secondary | ICD-10-CM | POA: Diagnosis not present

## 2021-01-17 DIAGNOSIS — R0789 Other chest pain: Secondary | ICD-10-CM | POA: Diagnosis not present

## 2021-01-17 DIAGNOSIS — I700 Atherosclerosis of aorta: Secondary | ICD-10-CM | POA: Diagnosis not present

## 2021-01-17 DIAGNOSIS — Z9049 Acquired absence of other specified parts of digestive tract: Secondary | ICD-10-CM | POA: Diagnosis not present

## 2021-01-17 DIAGNOSIS — R1013 Epigastric pain: Secondary | ICD-10-CM | POA: Diagnosis not present

## 2021-01-17 DIAGNOSIS — R0689 Other abnormalities of breathing: Secondary | ICD-10-CM | POA: Diagnosis not present

## 2021-01-17 DIAGNOSIS — Z79899 Other long term (current) drug therapy: Secondary | ICD-10-CM | POA: Diagnosis not present

## 2021-01-17 DIAGNOSIS — N400 Enlarged prostate without lower urinary tract symptoms: Secondary | ICD-10-CM | POA: Diagnosis not present

## 2021-01-17 DIAGNOSIS — R6889 Other general symptoms and signs: Secondary | ICD-10-CM | POA: Diagnosis not present

## 2021-01-17 DIAGNOSIS — J9811 Atelectasis: Secondary | ICD-10-CM | POA: Diagnosis not present

## 2021-01-18 DIAGNOSIS — I700 Atherosclerosis of aorta: Secondary | ICD-10-CM | POA: Diagnosis not present

## 2021-01-18 DIAGNOSIS — K449 Diaphragmatic hernia without obstruction or gangrene: Secondary | ICD-10-CM | POA: Diagnosis not present

## 2021-01-18 DIAGNOSIS — N400 Enlarged prostate without lower urinary tract symptoms: Secondary | ICD-10-CM | POA: Diagnosis not present

## 2021-01-18 DIAGNOSIS — K5730 Diverticulosis of large intestine without perforation or abscess without bleeding: Secondary | ICD-10-CM | POA: Diagnosis not present

## 2021-02-03 DIAGNOSIS — I10 Essential (primary) hypertension: Secondary | ICD-10-CM | POA: Diagnosis not present

## 2021-02-03 DIAGNOSIS — Z299 Encounter for prophylactic measures, unspecified: Secondary | ICD-10-CM | POA: Diagnosis not present

## 2021-02-03 DIAGNOSIS — H6120 Impacted cerumen, unspecified ear: Secondary | ICD-10-CM | POA: Diagnosis not present

## 2021-02-03 DIAGNOSIS — E1140 Type 2 diabetes mellitus with diabetic neuropathy, unspecified: Secondary | ICD-10-CM | POA: Diagnosis not present

## 2021-02-03 DIAGNOSIS — G5792 Unspecified mononeuropathy of left lower limb: Secondary | ICD-10-CM | POA: Diagnosis not present

## 2021-04-03 DIAGNOSIS — I10 Essential (primary) hypertension: Secondary | ICD-10-CM | POA: Diagnosis not present

## 2021-04-03 DIAGNOSIS — Z299 Encounter for prophylactic measures, unspecified: Secondary | ICD-10-CM | POA: Diagnosis not present

## 2021-04-03 DIAGNOSIS — J019 Acute sinusitis, unspecified: Secondary | ICD-10-CM | POA: Diagnosis not present

## 2021-04-03 DIAGNOSIS — E1165 Type 2 diabetes mellitus with hyperglycemia: Secondary | ICD-10-CM | POA: Diagnosis not present

## 2021-04-03 DIAGNOSIS — Z6826 Body mass index (BMI) 26.0-26.9, adult: Secondary | ICD-10-CM | POA: Diagnosis not present

## 2021-05-05 DIAGNOSIS — I10 Essential (primary) hypertension: Secondary | ICD-10-CM | POA: Diagnosis not present

## 2021-05-05 DIAGNOSIS — Z789 Other specified health status: Secondary | ICD-10-CM | POA: Diagnosis not present

## 2021-05-05 DIAGNOSIS — J069 Acute upper respiratory infection, unspecified: Secondary | ICD-10-CM | POA: Diagnosis not present

## 2021-05-05 DIAGNOSIS — Z299 Encounter for prophylactic measures, unspecified: Secondary | ICD-10-CM | POA: Diagnosis not present

## 2021-05-05 DIAGNOSIS — F325 Major depressive disorder, single episode, in full remission: Secondary | ICD-10-CM | POA: Diagnosis not present

## 2021-05-05 DIAGNOSIS — E1165 Type 2 diabetes mellitus with hyperglycemia: Secondary | ICD-10-CM | POA: Diagnosis not present

## 2021-11-28 DIAGNOSIS — Z7189 Other specified counseling: Secondary | ICD-10-CM | POA: Diagnosis not present

## 2021-11-28 DIAGNOSIS — R5383 Other fatigue: Secondary | ICD-10-CM | POA: Diagnosis not present

## 2021-11-28 DIAGNOSIS — Z79899 Other long term (current) drug therapy: Secondary | ICD-10-CM | POA: Diagnosis not present

## 2021-11-28 DIAGNOSIS — Z299 Encounter for prophylactic measures, unspecified: Secondary | ICD-10-CM | POA: Diagnosis not present

## 2021-11-28 DIAGNOSIS — E1165 Type 2 diabetes mellitus with hyperglycemia: Secondary | ICD-10-CM | POA: Diagnosis not present

## 2021-11-28 DIAGNOSIS — Z6826 Body mass index (BMI) 26.0-26.9, adult: Secondary | ICD-10-CM | POA: Diagnosis not present

## 2021-11-28 DIAGNOSIS — E7800 Pure hypercholesterolemia, unspecified: Secondary | ICD-10-CM | POA: Diagnosis not present

## 2021-11-28 DIAGNOSIS — Z0000 Encounter for general adult medical examination without abnormal findings: Secondary | ICD-10-CM | POA: Diagnosis not present

## 2021-11-28 DIAGNOSIS — I10 Essential (primary) hypertension: Secondary | ICD-10-CM | POA: Diagnosis not present

## 2021-11-28 DIAGNOSIS — Z1339 Encounter for screening examination for other mental health and behavioral disorders: Secondary | ICD-10-CM | POA: Diagnosis not present

## 2021-11-28 DIAGNOSIS — Z1331 Encounter for screening for depression: Secondary | ICD-10-CM | POA: Diagnosis not present

## 2021-11-28 DIAGNOSIS — Z125 Encounter for screening for malignant neoplasm of prostate: Secondary | ICD-10-CM | POA: Diagnosis not present

## 2022-01-22 DIAGNOSIS — U071 COVID-19: Secondary | ICD-10-CM | POA: Diagnosis not present

## 2022-01-22 DIAGNOSIS — M549 Dorsalgia, unspecified: Secondary | ICD-10-CM | POA: Diagnosis not present

## 2022-01-22 DIAGNOSIS — I10 Essential (primary) hypertension: Secondary | ICD-10-CM | POA: Diagnosis not present

## 2022-01-22 DIAGNOSIS — E1140 Type 2 diabetes mellitus with diabetic neuropathy, unspecified: Secondary | ICD-10-CM | POA: Diagnosis not present

## 2022-01-22 DIAGNOSIS — Z299 Encounter for prophylactic measures, unspecified: Secondary | ICD-10-CM | POA: Diagnosis not present

## 2022-02-03 DIAGNOSIS — H2513 Age-related nuclear cataract, bilateral: Secondary | ICD-10-CM | POA: Diagnosis not present

## 2022-04-17 DIAGNOSIS — E1165 Type 2 diabetes mellitus with hyperglycemia: Secondary | ICD-10-CM | POA: Diagnosis not present

## 2022-04-17 DIAGNOSIS — Z299 Encounter for prophylactic measures, unspecified: Secondary | ICD-10-CM | POA: Diagnosis not present

## 2022-04-17 DIAGNOSIS — H68102 Unspecified obstruction of Eustachian tube, left ear: Secondary | ICD-10-CM | POA: Diagnosis not present

## 2022-04-17 DIAGNOSIS — M549 Dorsalgia, unspecified: Secondary | ICD-10-CM | POA: Diagnosis not present

## 2022-04-17 DIAGNOSIS — F325 Major depressive disorder, single episode, in full remission: Secondary | ICD-10-CM | POA: Diagnosis not present

## 2022-04-17 DIAGNOSIS — I10 Essential (primary) hypertension: Secondary | ICD-10-CM | POA: Diagnosis not present

## 2022-04-30 DIAGNOSIS — T162XXA Foreign body in left ear, initial encounter: Secondary | ICD-10-CM | POA: Diagnosis not present

## 2022-07-10 DIAGNOSIS — E1140 Type 2 diabetes mellitus with diabetic neuropathy, unspecified: Secondary | ICD-10-CM | POA: Diagnosis not present

## 2022-07-10 DIAGNOSIS — I10 Essential (primary) hypertension: Secondary | ICD-10-CM | POA: Diagnosis not present

## 2022-07-10 DIAGNOSIS — Z299 Encounter for prophylactic measures, unspecified: Secondary | ICD-10-CM | POA: Diagnosis not present

## 2022-07-10 DIAGNOSIS — K219 Gastro-esophageal reflux disease without esophagitis: Secondary | ICD-10-CM | POA: Diagnosis not present

## 2022-07-10 DIAGNOSIS — E1122 Type 2 diabetes mellitus with diabetic chronic kidney disease: Secondary | ICD-10-CM | POA: Diagnosis not present

## 2022-07-23 DIAGNOSIS — I10 Essential (primary) hypertension: Secondary | ICD-10-CM | POA: Diagnosis not present

## 2022-07-23 DIAGNOSIS — E1165 Type 2 diabetes mellitus with hyperglycemia: Secondary | ICD-10-CM | POA: Diagnosis not present

## 2022-07-23 DIAGNOSIS — H6120 Impacted cerumen, unspecified ear: Secondary | ICD-10-CM | POA: Diagnosis not present

## 2022-07-23 DIAGNOSIS — Z299 Encounter for prophylactic measures, unspecified: Secondary | ICD-10-CM | POA: Diagnosis not present

## 2022-07-24 DIAGNOSIS — H6123 Impacted cerumen, bilateral: Secondary | ICD-10-CM | POA: Diagnosis not present

## 2022-08-20 DIAGNOSIS — I10 Essential (primary) hypertension: Secondary | ICD-10-CM | POA: Diagnosis not present

## 2022-08-20 DIAGNOSIS — Z299 Encounter for prophylactic measures, unspecified: Secondary | ICD-10-CM | POA: Diagnosis not present

## 2022-08-20 DIAGNOSIS — H6123 Impacted cerumen, bilateral: Secondary | ICD-10-CM | POA: Diagnosis not present

## 2022-10-15 DIAGNOSIS — Z299 Encounter for prophylactic measures, unspecified: Secondary | ICD-10-CM | POA: Diagnosis not present

## 2022-10-15 DIAGNOSIS — R238 Other skin changes: Secondary | ICD-10-CM | POA: Diagnosis not present

## 2022-10-15 DIAGNOSIS — I10 Essential (primary) hypertension: Secondary | ICD-10-CM | POA: Diagnosis not present

## 2022-10-15 DIAGNOSIS — L989 Disorder of the skin and subcutaneous tissue, unspecified: Secondary | ICD-10-CM | POA: Diagnosis not present

## 2022-10-30 DIAGNOSIS — L98499 Non-pressure chronic ulcer of skin of other sites with unspecified severity: Secondary | ICD-10-CM | POA: Diagnosis not present

## 2022-10-30 DIAGNOSIS — L989 Disorder of the skin and subcutaneous tissue, unspecified: Secondary | ICD-10-CM | POA: Diagnosis not present

## 2022-10-30 DIAGNOSIS — E1165 Type 2 diabetes mellitus with hyperglycemia: Secondary | ICD-10-CM | POA: Diagnosis not present

## 2022-10-30 DIAGNOSIS — Z299 Encounter for prophylactic measures, unspecified: Secondary | ICD-10-CM | POA: Diagnosis not present

## 2022-10-30 DIAGNOSIS — F424 Excoriation (skin-picking) disorder: Secondary | ICD-10-CM | POA: Diagnosis not present

## 2022-10-30 DIAGNOSIS — I10 Essential (primary) hypertension: Secondary | ICD-10-CM | POA: Diagnosis not present

## 2022-10-30 DIAGNOSIS — E1122 Type 2 diabetes mellitus with diabetic chronic kidney disease: Secondary | ICD-10-CM | POA: Diagnosis not present

## 2022-11-24 DIAGNOSIS — R110 Nausea: Secondary | ICD-10-CM | POA: Diagnosis not present

## 2022-11-24 DIAGNOSIS — Z299 Encounter for prophylactic measures, unspecified: Secondary | ICD-10-CM | POA: Diagnosis not present

## 2022-11-24 DIAGNOSIS — I10 Essential (primary) hypertension: Secondary | ICD-10-CM | POA: Diagnosis not present

## 2022-11-24 DIAGNOSIS — T63481A Toxic effect of venom of other arthropod, accidental (unintentional), initial encounter: Secondary | ICD-10-CM | POA: Diagnosis not present

## 2022-11-26 DIAGNOSIS — K2970 Gastritis, unspecified, without bleeding: Secondary | ICD-10-CM | POA: Diagnosis not present

## 2022-11-26 DIAGNOSIS — I10 Essential (primary) hypertension: Secondary | ICD-10-CM | POA: Diagnosis not present

## 2022-11-26 DIAGNOSIS — R197 Diarrhea, unspecified: Secondary | ICD-10-CM | POA: Diagnosis not present

## 2022-11-26 DIAGNOSIS — R112 Nausea with vomiting, unspecified: Secondary | ICD-10-CM | POA: Diagnosis not present

## 2022-11-26 DIAGNOSIS — R6883 Chills (without fever): Secondary | ICD-10-CM | POA: Diagnosis not present

## 2022-11-26 DIAGNOSIS — Z299 Encounter for prophylactic measures, unspecified: Secondary | ICD-10-CM | POA: Diagnosis not present

## 2022-12-01 DIAGNOSIS — Z0000 Encounter for general adult medical examination without abnormal findings: Secondary | ICD-10-CM | POA: Diagnosis not present

## 2022-12-01 DIAGNOSIS — E1122 Type 2 diabetes mellitus with diabetic chronic kidney disease: Secondary | ICD-10-CM | POA: Diagnosis not present

## 2022-12-01 DIAGNOSIS — I10 Essential (primary) hypertension: Secondary | ICD-10-CM | POA: Diagnosis not present

## 2022-12-01 DIAGNOSIS — R5383 Other fatigue: Secondary | ICD-10-CM | POA: Diagnosis not present

## 2022-12-01 DIAGNOSIS — Z1339 Encounter for screening examination for other mental health and behavioral disorders: Secondary | ICD-10-CM | POA: Diagnosis not present

## 2022-12-01 DIAGNOSIS — Z79899 Other long term (current) drug therapy: Secondary | ICD-10-CM | POA: Diagnosis not present

## 2022-12-01 DIAGNOSIS — Z7189 Other specified counseling: Secondary | ICD-10-CM | POA: Diagnosis not present

## 2022-12-01 DIAGNOSIS — Z299 Encounter for prophylactic measures, unspecified: Secondary | ICD-10-CM | POA: Diagnosis not present

## 2022-12-01 DIAGNOSIS — Z1331 Encounter for screening for depression: Secondary | ICD-10-CM | POA: Diagnosis not present

## 2022-12-01 DIAGNOSIS — E7800 Pure hypercholesterolemia, unspecified: Secondary | ICD-10-CM | POA: Diagnosis not present

## 2023-02-01 DIAGNOSIS — F325 Major depressive disorder, single episode, in full remission: Secondary | ICD-10-CM | POA: Diagnosis not present

## 2023-02-01 DIAGNOSIS — I10 Essential (primary) hypertension: Secondary | ICD-10-CM | POA: Diagnosis not present

## 2023-02-01 DIAGNOSIS — E1165 Type 2 diabetes mellitus with hyperglycemia: Secondary | ICD-10-CM | POA: Diagnosis not present

## 2023-02-01 DIAGNOSIS — Z23 Encounter for immunization: Secondary | ICD-10-CM | POA: Diagnosis not present

## 2023-02-01 DIAGNOSIS — E119 Type 2 diabetes mellitus without complications: Secondary | ICD-10-CM | POA: Diagnosis not present

## 2023-02-01 DIAGNOSIS — Z0000 Encounter for general adult medical examination without abnormal findings: Secondary | ICD-10-CM | POA: Diagnosis not present

## 2023-02-01 DIAGNOSIS — Z299 Encounter for prophylactic measures, unspecified: Secondary | ICD-10-CM | POA: Diagnosis not present

## 2023-02-05 DIAGNOSIS — Z299 Encounter for prophylactic measures, unspecified: Secondary | ICD-10-CM | POA: Diagnosis not present

## 2023-02-05 DIAGNOSIS — E1165 Type 2 diabetes mellitus with hyperglycemia: Secondary | ICD-10-CM | POA: Diagnosis not present

## 2023-02-05 DIAGNOSIS — Z79899 Other long term (current) drug therapy: Secondary | ICD-10-CM | POA: Diagnosis not present

## 2023-02-05 DIAGNOSIS — R5383 Other fatigue: Secondary | ICD-10-CM | POA: Diagnosis not present

## 2023-02-05 DIAGNOSIS — E1140 Type 2 diabetes mellitus with diabetic neuropathy, unspecified: Secondary | ICD-10-CM | POA: Diagnosis not present

## 2023-02-05 DIAGNOSIS — Z125 Encounter for screening for malignant neoplasm of prostate: Secondary | ICD-10-CM | POA: Diagnosis not present

## 2023-02-05 DIAGNOSIS — I10 Essential (primary) hypertension: Secondary | ICD-10-CM | POA: Diagnosis not present

## 2023-02-05 DIAGNOSIS — E7800 Pure hypercholesterolemia, unspecified: Secondary | ICD-10-CM | POA: Diagnosis not present

## 2023-02-05 DIAGNOSIS — K219 Gastro-esophageal reflux disease without esophagitis: Secondary | ICD-10-CM | POA: Diagnosis not present

## 2023-03-15 DIAGNOSIS — H01004 Unspecified blepharitis left upper eyelid: Secondary | ICD-10-CM | POA: Diagnosis not present

## 2023-03-15 DIAGNOSIS — H2513 Age-related nuclear cataract, bilateral: Secondary | ICD-10-CM | POA: Diagnosis not present

## 2023-03-15 DIAGNOSIS — H01001 Unspecified blepharitis right upper eyelid: Secondary | ICD-10-CM | POA: Diagnosis not present

## 2023-03-15 DIAGNOSIS — H01002 Unspecified blepharitis right lower eyelid: Secondary | ICD-10-CM | POA: Diagnosis not present

## 2023-04-27 ENCOUNTER — Encounter (HOSPITAL_COMMUNITY)
Admission: RE | Admit: 2023-04-27 | Discharge: 2023-04-27 | Disposition: A | Discharge status: Home / Self Care | Payer: Medicare HMO | Source: Ambulatory Visit | Attending: Ophthalmology | Admitting: Ophthalmology

## 2023-04-27 NOTE — Pre-Procedure Instructions (Signed)
 Attempted pre-op phone call. Pone states, "call cannot be completed at this time. Please try your call again later". Could not leave VM.

## 2023-04-29 NOTE — H&P (Signed)
 Surgical History & Physical  Patient Name: Albert [name]  DOB: Aug 18, 1945  Surgery: Cataract extraction with intraocular lens implant phacoemulsification; Left Eye Surgeon: Lynwood Hermann MD Surgery Date: 04/30/2023 Pre-Op Date: 03/15/2023  HPI: A 48 Yr. old male patient 1. The patient complains of cataract eval. Both eyes are affected. The episode is constant. The condition's severity decreased . The complaint is associated with blurry vision. The patient experiences no flashes, floater, shadow, curtain or veil. The patient has trouble reading small print on a medicine bottle. This is negatively affecting the patient's quality of life and the patient is unable to function adequately in life with the current level of vision. HPI Completed by Dr. Lynwood Hermann  Medical History:  High Blood Pressure Stroke  Review of Systems Negative Allergic/Immunologic Hypertension Cardiovascular Negative Constitutional Negative Ear, Nose, Mouth & Throat Negative Endocrine Negative Eyes Negative Gastrointestinal Negative Genitourinary Negative Hemotologic/Lymphatic Negative Integumentary Negative Musculoskeletal Stroke Neurological Negative Psychiatry Negative Respiratory  Social Never Smoked  Medication propranolol ,  pravastatin  ,  sertraline  ,  pantoprazole  ,  naproxen ,  gabapentin ,  lisinopril -hydrochlorothiazide   Sx/Procedures None  Drug Allergies  Penicillin  History & Physical: Heent: cataracts NECK: supple without bruits LUNGS: lungs clear to auscultation CV: regular rate and rhythm Abdomen: soft and non-tender  Impression & Plan: Assessment: 1.  NUCLEAR SCLEROSIS AGE RELATED; Both Eyes (H25.13) 2.  ASTIGMATISM, REGULAR; Both Eyes (H52.223) 3.  BLEPHARITIS; Right Upper Lid, Right Lower Lid, Left Upper Lid, Left Lower Lid (H01.001, H01.002,H01.004,H01.005) 4.  DERMATOCHALASIS, no surgery; Right Upper Lid, Left Upper Lid (H02.831, H02.834) 5.  Pinguecula; Right Eye  (H11.151) 6.  ARCUS SENILIS; Both Eyes (H18.413)  Plan: 1.  Cataract accounts for the patient's decreased vision. This visual impairment is not correctable with a tolerable change in glasses or contact lenses. Cataract surgery with an implantation of a new lens should significantly improve the visual and functional status of the patient. Discussed all risks, benefits, alternatives, and potential complications. Discussed the procedures and recovery. Patient desires to have surgery. A-scan ordered and performed today for intra-ocular lens calculations. The surgery will be performed in order to improve vision for driving, reading, and for eye examinations. Recommend phacoemulsification with intra-ocular lens. Recommend Dextenza for post-operative pain and inflammation. Left Eye worse - first. Dilates poorly - shugarcaine by protocol. Malyugin Ring. Omidira. Toric Lens. - Veteran, waive surgeon's fees.  2.  Recommend toric IOL OU.  3.  Blepharitis is present - recommend regular lid cleaning.  4.  Likely visually significant. Asymptomatic, recommend observation for now. Findings, prognosis and treatment options reviewed.  5.  Observe; Artificial tears as needed for irritation.  6.  Discussed significance of finding Answered patient questions about finding

## 2023-04-29 NOTE — Progress Notes (Signed)
 Patient called short stay and stated he could not pay his copay for his cataract surgery on 04/29/22 and needs to cancel the procedure.  Informed patient to inform the office.  Patient voiced understanding.

## 2023-04-30 ENCOUNTER — Ambulatory Visit (HOSPITAL_COMMUNITY): Admission: RE | Admit: 2023-04-30 | Payer: Medicare HMO | Source: Ambulatory Visit | Admitting: Ophthalmology

## 2023-04-30 ENCOUNTER — Encounter (HOSPITAL_COMMUNITY): Admission: RE | Payer: Self-pay | Source: Ambulatory Visit

## 2023-04-30 SURGERY — CATARACT EXTRACTION PHACO AND INTRAOCULAR LENS PLACEMENT (IOC)
Anesthesia: Monitor Anesthesia Care | Laterality: Left

## 2023-09-01 DIAGNOSIS — Z9181 History of falling: Secondary | ICD-10-CM | POA: Diagnosis not present

## 2023-09-01 DIAGNOSIS — R52 Pain, unspecified: Secondary | ICD-10-CM | POA: Diagnosis not present

## 2023-09-01 DIAGNOSIS — I10 Essential (primary) hypertension: Secondary | ICD-10-CM | POA: Diagnosis not present

## 2023-09-01 DIAGNOSIS — E1169 Type 2 diabetes mellitus with other specified complication: Secondary | ICD-10-CM | POA: Diagnosis not present

## 2023-09-01 DIAGNOSIS — Z299 Encounter for prophylactic measures, unspecified: Secondary | ICD-10-CM | POA: Diagnosis not present

## 2023-10-18 DIAGNOSIS — Z7189 Other specified counseling: Secondary | ICD-10-CM | POA: Diagnosis not present

## 2023-10-18 DIAGNOSIS — Z1389 Encounter for screening for other disorder: Secondary | ICD-10-CM | POA: Diagnosis not present

## 2023-10-18 DIAGNOSIS — Z0000 Encounter for general adult medical examination without abnormal findings: Secondary | ICD-10-CM | POA: Diagnosis not present

## 2023-10-18 DIAGNOSIS — I10 Essential (primary) hypertension: Secondary | ICD-10-CM | POA: Diagnosis not present

## 2023-10-18 DIAGNOSIS — E7800 Pure hypercholesterolemia, unspecified: Secondary | ICD-10-CM | POA: Diagnosis not present

## 2023-10-18 DIAGNOSIS — Z753 Unavailability and inaccessibility of health-care facilities: Secondary | ICD-10-CM | POA: Diagnosis not present

## 2023-10-18 DIAGNOSIS — Z299 Encounter for prophylactic measures, unspecified: Secondary | ICD-10-CM | POA: Diagnosis not present

## 2023-10-18 DIAGNOSIS — Z79899 Other long term (current) drug therapy: Secondary | ICD-10-CM | POA: Diagnosis not present

## 2023-10-18 DIAGNOSIS — R5383 Other fatigue: Secondary | ICD-10-CM | POA: Diagnosis not present

## 2024-01-03 ENCOUNTER — Other Ambulatory Visit (HOSPITAL_COMMUNITY): Payer: Self-pay | Admitting: Internal Medicine

## 2024-01-03 ENCOUNTER — Ambulatory Visit (HOSPITAL_COMMUNITY)
Admission: RE | Admit: 2024-01-03 | Discharge: 2024-01-03 | Disposition: A | Discharge status: Home / Self Care | Source: Ambulatory Visit | Attending: Internal Medicine | Admitting: Internal Medicine

## 2024-01-03 ENCOUNTER — Other Ambulatory Visit (HOSPITAL_COMMUNITY)
Admission: RE | Admit: 2024-01-03 | Discharge: 2024-01-03 | Disposition: A | Discharge status: Home / Self Care | Source: Ambulatory Visit | Attending: Internal Medicine | Admitting: Internal Medicine

## 2024-01-03 DIAGNOSIS — R1010 Upper abdominal pain, unspecified: Secondary | ICD-10-CM | POA: Diagnosis not present

## 2024-01-03 DIAGNOSIS — Z299 Encounter for prophylactic measures, unspecified: Secondary | ICD-10-CM | POA: Diagnosis not present

## 2024-01-03 DIAGNOSIS — E1169 Type 2 diabetes mellitus with other specified complication: Secondary | ICD-10-CM | POA: Diagnosis not present

## 2024-01-03 DIAGNOSIS — K449 Diaphragmatic hernia without obstruction or gangrene: Secondary | ICD-10-CM | POA: Diagnosis not present

## 2024-01-03 DIAGNOSIS — N400 Enlarged prostate without lower urinary tract symptoms: Secondary | ICD-10-CM | POA: Diagnosis not present

## 2024-01-03 DIAGNOSIS — K6389 Other specified diseases of intestine: Secondary | ICD-10-CM | POA: Diagnosis not present

## 2024-01-03 DIAGNOSIS — I10 Essential (primary) hypertension: Secondary | ICD-10-CM | POA: Diagnosis not present

## 2024-01-03 DIAGNOSIS — R52 Pain, unspecified: Secondary | ICD-10-CM | POA: Diagnosis not present

## 2024-01-03 DIAGNOSIS — R112 Nausea with vomiting, unspecified: Secondary | ICD-10-CM | POA: Diagnosis not present

## 2024-01-03 DIAGNOSIS — K5710 Diverticulosis of small intestine without perforation or abscess without bleeding: Secondary | ICD-10-CM | POA: Diagnosis not present

## 2024-01-03 LAB — COMPREHENSIVE METABOLIC PANEL WITH GFR
ALT: 28 U/L (ref 0–44)
AST: 26 U/L (ref 15–41)
Albumin: 3.9 g/dL (ref 3.5–5.0)
Alkaline Phosphatase: 59 U/L (ref 38–126)
Anion gap: 12 (ref 5–15)
BUN: 21 mg/dL (ref 8–23)
CO2: 25 mmol/L (ref 22–32)
Calcium: 9.1 mg/dL (ref 8.9–10.3)
Chloride: 101 mmol/L (ref 98–111)
Creatinine, Ser: 1.36 mg/dL — ABNORMAL HIGH (ref 0.61–1.24)
GFR, Estimated: 54 mL/min — ABNORMAL LOW (ref >60)
Glucose, Bld: 108 mg/dL — ABNORMAL HIGH (ref 70–99)
Potassium: 3.6 mmol/L (ref 3.5–5.1)
Sodium: 138 mmol/L (ref 135–145)
Total Bilirubin: 0.9 mg/dL (ref 0.0–1.2)
Total Protein: 7.3 g/dL (ref 6.5–8.1)

## 2024-01-03 LAB — CBC
HCT: 43.8 % (ref 39.0–52.0)
Hemoglobin: 14.3 g/dL (ref 13.0–17.0)
MCH: 29.1 pg (ref 26.0–34.0)
MCHC: 32.6 g/dL (ref 30.0–36.0)
MCV: 89.2 fL (ref 80.0–100.0)
Platelets: 223 K/uL (ref 150–400)
RBC: 4.91 MIL/uL (ref 4.22–5.81)
RDW: 13.4 % (ref 11.5–15.5)
WBC: 6.9 K/uL (ref 4.0–10.5)
nRBC: 0 % (ref 0.0–0.2)

## 2024-01-14 DIAGNOSIS — K449 Diaphragmatic hernia without obstruction or gangrene: Secondary | ICD-10-CM | POA: Diagnosis not present

## 2024-01-14 DIAGNOSIS — I700 Atherosclerosis of aorta: Secondary | ICD-10-CM | POA: Diagnosis not present

## 2024-01-14 DIAGNOSIS — Z299 Encounter for prophylactic measures, unspecified: Secondary | ICD-10-CM | POA: Diagnosis not present

## 2024-01-14 DIAGNOSIS — Z23 Encounter for immunization: Secondary | ICD-10-CM | POA: Diagnosis not present

## 2024-01-14 DIAGNOSIS — R197 Diarrhea, unspecified: Secondary | ICD-10-CM | POA: Diagnosis not present

## 2024-01-14 DIAGNOSIS — I10 Essential (primary) hypertension: Secondary | ICD-10-CM | POA: Diagnosis not present

## 2024-02-02 ENCOUNTER — Encounter: Payer: Self-pay | Admitting: Internal Medicine

## 2024-03-02 ENCOUNTER — Ambulatory Visit: Admitting: Internal Medicine

## 2024-03-02 ENCOUNTER — Telehealth: Payer: Self-pay | Admitting: *Deleted

## 2024-03-02 VITALS — BP 135/82 | HR 52 | Temp 98.6°F | Ht 70.0 in | Wt 176.6 lb

## 2024-03-02 DIAGNOSIS — R1319 Other dysphagia: Secondary | ICD-10-CM

## 2024-03-02 DIAGNOSIS — R112 Nausea with vomiting, unspecified: Secondary | ICD-10-CM | POA: Diagnosis not present

## 2024-03-02 DIAGNOSIS — R634 Abnormal weight loss: Secondary | ICD-10-CM

## 2024-03-02 DIAGNOSIS — K219 Gastro-esophageal reflux disease without esophagitis: Secondary | ICD-10-CM | POA: Diagnosis not present

## 2024-03-02 DIAGNOSIS — R1085 Abdominal pain of multiple sites: Secondary | ICD-10-CM

## 2024-03-02 DIAGNOSIS — K449 Diaphragmatic hernia without obstruction or gangrene: Secondary | ICD-10-CM

## 2024-03-02 NOTE — Patient Instructions (Signed)
 We will schedule you for upper endoscopy to further evaluate your vomiting, weight loss, hiatal hernia.  We may need to consider surgical referral depending on findings.  Continue on pantoprazole daily.  It was very nice meeting you today.  Dr. Cindie

## 2024-03-02 NOTE — H&P (View-Only) (Signed)
 Primary Care Physician:  Teresa Jenkins Jansky, FNP Primary Gastroenterologist:  Dr. Cindie  Chief Complaint  Patient presents with   New Patient (Initial Visit)    Pt hasn't had diarrhea lately, but he complains of painful hernia. Pt has had CAT scan done    HPI:   Albert [name] is a 78 y.o. male who presents to clinic today by referral from his PCP Dr. Maree for evaluation.  Patient reports for the last 2 to 3 months he has had significant esophageal dysphagia.  Occurs daily.  Has to be very careful with his meals.  Notes vomiting daily due to obstructive symptoms.  Has noted 12 pound weight loss over the last 2 months.  Denies any melena or hematochezia.  Does note abdominal pain, primarily periumbilical, right lower quadrant, left lower quadrant.  Mild to moderate in severity, intermittent.  CT abdomen pelvis with contrast 01/03/2024 which I personally reviewed showed large hiatal hernia containing majority of the stomach and a portion of the distal transverse colon.  Distal ascending and transverse colon are dilated to the level of the hernia.  Mild proximal gastric wall thickening.  Taking pantoprazole 40 mg daily which he states helps with his reflux.  Colonoscopy 2017 at the [facility] per PCP note.  No family history of colorectal malignancy.     Past Medical History:  Diagnosis Date   Anxiety    Depression    Diabetes (HCC)    type 2   Dizziness    GERD (gastroesophageal reflux disease)    Heart murmur    Hyperlipidemia    Hypertension    Myocardial infarction [facility]) 2008   Neuropathy    left foot   Spinal stenosis    Stroke (HCC) 2008    Past Surgical History:  Procedure Laterality Date   APPENDECTOMY  1980    Current Outpatient Medications  Medication Sig Dispense Refill   amLODipine  (NORVASC ) 10 MG tablet Take 1 tablet (10 mg total) by mouth daily. 90 tablet 1   aspirin 81 MG tablet Take 160 mg by mouth 2 (two) times daily.      gabapentin (NEURONTIN) 100  MG capsule Take 100 mg by mouth 3 (three) times daily.     lisinopril  (PRINIVIL ,ZESTRIL ) 10 MG tablet TAKE 1 TABLET EVERY DAY  (DOSE INCREASE 10/6 AND DISCONTINUE ATENOLOL ) 90 tablet 1   Multiple Vitamins-Minerals (MULTIVITAMIN ADULT PO) Take 1 tablet by mouth daily.     ondansetron  (ZOFRAN ) 4 MG tablet 4 mg as needed.     pantoprazole (PROTONIX) 40 MG tablet 40 mg daily.     pravastatin  (PRAVACHOL ) 40 MG tablet Take 1 tablet (40 mg total) by mouth daily. 90 tablet 1   sertraline  (ZOLOFT ) 100 MG tablet Take 2 tablets (200 mg total) by mouth daily. 180 tablet 0   temazepam (RESTORIL) 15 MG capsule 15 mg daily.     TYLENOL 500 MG tablet Take 1 tablet by mouth 2 (two) times daily.     LORazepam  (ATIVAN ) 0.5 MG tablet Take 1 tablet (0.5 mg total) by mouth 2 (two) times daily. (Patient not taking: Reported on 03/02/2024) 60 tablet 2   Misc. Devices (QUAD CANE) MISC Use to balance and to prevent falls (Dx R29.6 - high fall risk) (Patient not taking: Reported on 03/02/2024) 1 each 0   No current facility-administered medications for this visit.    Allergies as of 03/02/2024 - Review Complete 03/02/2024  Allergen Reaction Noted   Ciprofloxacin Other (See  Comments) 05/12/2017   Diovan [valsartan] Nausea And Vomiting 12/07/2013   Hydrocodone Nausea And Vomiting 12/07/2013   Penicillins  12/07/2013    Family History  Problem Relation Age of Onset   Heart disease Mother    Hypertension Mother    Heart attack Mother    Cancer Mother    Heart disease Father    Heart attack Father    Hypertension Brother    Heart disease Brother    Heart attack Sister    Heart attack Sister     Social History   Socioeconomic History   Marital status: Widowed    Spouse name: Not on file   Number of children: 3   Years of education: 8   Highest education level: Not on file  Occupational History   Not on file  Tobacco Use   Smoking status: Never   Smokeless tobacco: Never  Substance and Sexual Activity    Alcohol  use: No    Alcohol /week: 0.0 standard drinks of alcohol    Drug use: No   Sexual activity: Never  Other Topics Concern   Not on file  Social History Narrative   Lives alone with dog   Caffeine- coffee 1 daily, occas Dr Nunzio   Social Drivers of Health   Financial Resource Strain: Not on file  Food Insecurity: Not on file  Transportation Needs: Not on file  Physical Activity: Not on file  Stress: Not on file  Social Connections: Not on file  Intimate Partner Violence: Not on file    Subjective: Review of Systems  Constitutional:  Negative for chills and fever.  HENT:  Negative for congestion and hearing loss.   Eyes:  Negative for blurred vision and double vision.  Respiratory:  Negative for cough and shortness of breath.   Cardiovascular:  Negative for chest pain and palpitations.  Gastrointestinal:  Positive for heartburn and vomiting. Negative for abdominal pain, blood in stool, constipation, diarrhea and melena.       Dysphagia  Genitourinary:  Negative for dysuria and urgency.  Musculoskeletal:  Negative for joint pain and myalgias.  Skin:  Negative for itching and rash.  Neurological:  Negative for dizziness and headaches.  Psychiatric/Behavioral:  Negative for depression. The patient is not nervous/anxious.        Objective: BP 135/82   Pulse (!) 52   Temp 98.6 F (37 C)   Ht 5' 10 (1.778 m)   Wt 176 lb 9.6 oz (80.1 kg)   BMI 25.34 kg/m  Physical Exam Constitutional:      Appearance: Normal appearance.  HENT:     Head: Normocephalic and atraumatic.  Eyes:     Extraocular Movements: Extraocular movements intact.     Conjunctiva/sclera: Conjunctivae normal.  Cardiovascular:     Rate and Rhythm: Normal rate and regular rhythm.  Pulmonary:     Effort: Pulmonary effort is normal.     Breath sounds: Normal breath sounds.  Abdominal:     General: Bowel sounds are normal.     Palpations: Abdomen is soft.  Musculoskeletal:        General:  Normal range of motion.     Cervical back: Normal range of motion and neck supple.  Skin:    General: Skin is warm.  Neurological:     General: No focal deficit present.     Mental Status: He is alert and oriented to person, place, and time.  Psychiatric:        Mood and Affect: Mood normal.  Behavior: Behavior normal.      Assessment: *Esophageal dysphagia *Nausea and vomiting *Weight loss *Hiatal hernia *Abdominal pain  Plan: Discussed CT results in depth with patient today. Will schedule for EGD with possible esophageal dilation to evaluate for peptic ulcer disease, esophagitis, gastritis, H. Pylori, duodenitis, or other. Will also evaluate for esophageal stricture, Schatzki's ring, esophageal web or other.   The risks including infection, bleed, or perforation as well as benefits, limitations, alternatives and imponderables have been reviewed with the patient. Potential for esophageal dilation, biopsy, etc. have also been reviewed.  Questions have been answered. All parties agreeable.  May need to consider surgical referral for hernia repair depending on findings.  Continue on pantoprazole daily.  Patient to avoid tough textures. All meats should be chopped finely. Eat slowly, take small bites, chew thoroughly, and drink plenty of liquids throughout meals. Advised if something were to get hung in patient's esophagus and not come up or go down, they should proceed to the emergency room.  Thank you Dr. Maree for the kind referral 03/02/2024 8:56 AM

## 2024-03-02 NOTE — Telephone Encounter (Signed)
 ATC PT, no answer and VM not set up Needs EGD with Dr. Cindie, ASA 3

## 2024-03-02 NOTE — Progress Notes (Signed)
 Primary Care Physician:  Teresa Jenkins Jansky, FNP Primary Gastroenterologist:  Dr. Cindie  Chief Complaint  Patient presents with   New Patient (Initial Visit)    Pt hasn't had diarrhea lately, but he complains of painful hernia. Pt has had CAT scan done    HPI:   Albert [name] is a 78 y.o. male who presents to clinic today by referral from his PCP Dr. Maree for evaluation.  Patient reports for the last 2 to 3 months he has had significant esophageal dysphagia.  Occurs daily.  Has to be very careful with his meals.  Notes vomiting daily due to obstructive symptoms.  Has noted 12 pound weight loss over the last 2 months.  Denies any melena or hematochezia.  Does note abdominal pain, primarily periumbilical, right lower quadrant, left lower quadrant.  Mild to moderate in severity, intermittent.  CT abdomen pelvis with contrast 01/03/2024 which I personally reviewed showed large hiatal hernia containing majority of the stomach and a portion of the distal transverse colon.  Distal ascending and transverse colon are dilated to the level of the hernia.  Mild proximal gastric wall thickening.  Taking pantoprazole 40 mg daily which he states helps with his reflux.  Colonoscopy 2017 at the [facility] per PCP note.  No family history of colorectal malignancy.     Past Medical History:  Diagnosis Date   Anxiety    Depression    Diabetes (HCC)    type 2   Dizziness    GERD (gastroesophageal reflux disease)    Heart murmur    Hyperlipidemia    Hypertension    Myocardial infarction [facility]) 2008   Neuropathy    left foot   Spinal stenosis    Stroke (HCC) 2008    Past Surgical History:  Procedure Laterality Date   APPENDECTOMY  1980    Current Outpatient Medications  Medication Sig Dispense Refill   amLODipine  (NORVASC ) 10 MG tablet Take 1 tablet (10 mg total) by mouth daily. 90 tablet 1   aspirin 81 MG tablet Take 160 mg by mouth 2 (two) times daily.      gabapentin (NEURONTIN) 100  MG capsule Take 100 mg by mouth 3 (three) times daily.     lisinopril  (PRINIVIL ,ZESTRIL ) 10 MG tablet TAKE 1 TABLET EVERY DAY  (DOSE INCREASE 10/6 AND DISCONTINUE ATENOLOL ) 90 tablet 1   Multiple Vitamins-Minerals (MULTIVITAMIN ADULT PO) Take 1 tablet by mouth daily.     ondansetron  (ZOFRAN ) 4 MG tablet 4 mg as needed.     pantoprazole (PROTONIX) 40 MG tablet 40 mg daily.     pravastatin  (PRAVACHOL ) 40 MG tablet Take 1 tablet (40 mg total) by mouth daily. 90 tablet 1   sertraline  (ZOLOFT ) 100 MG tablet Take 2 tablets (200 mg total) by mouth daily. 180 tablet 0   temazepam (RESTORIL) 15 MG capsule 15 mg daily.     TYLENOL 500 MG tablet Take 1 tablet by mouth 2 (two) times daily.     LORazepam  (ATIVAN ) 0.5 MG tablet Take 1 tablet (0.5 mg total) by mouth 2 (two) times daily. (Patient not taking: Reported on 03/02/2024) 60 tablet 2   Misc. Devices (QUAD CANE) MISC Use to balance and to prevent falls (Dx R29.6 - high fall risk) (Patient not taking: Reported on 03/02/2024) 1 each 0   No current facility-administered medications for this visit.    Allergies as of 03/02/2024 - Review Complete 03/02/2024  Allergen Reaction Noted   Ciprofloxacin Other (See  Comments) 05/12/2017   Diovan [valsartan] Nausea And Vomiting 12/07/2013   Hydrocodone Nausea And Vomiting 12/07/2013   Penicillins  12/07/2013    Family History  Problem Relation Age of Onset   Heart disease Mother    Hypertension Mother    Heart attack Mother    Cancer Mother    Heart disease Father    Heart attack Father    Hypertension Brother    Heart disease Brother    Heart attack Sister    Heart attack Sister     Social History   Socioeconomic History   Marital status: Widowed    Spouse name: Not on file   Number of children: 3   Years of education: 8   Highest education level: Not on file  Occupational History   Not on file  Tobacco Use   Smoking status: Never   Smokeless tobacco: Never  Substance and Sexual Activity    Alcohol  use: No    Alcohol /week: 0.0 standard drinks of alcohol    Drug use: No   Sexual activity: Never  Other Topics Concern   Not on file  Social History Narrative   Lives alone with dog   Caffeine- coffee 1 daily, occas Dr Nunzio   Social Drivers of Health   Financial Resource Strain: Not on file  Food Insecurity: Not on file  Transportation Needs: Not on file  Physical Activity: Not on file  Stress: Not on file  Social Connections: Not on file  Intimate Partner Violence: Not on file    Subjective: Review of Systems  Constitutional:  Negative for chills and fever.  HENT:  Negative for congestion and hearing loss.   Eyes:  Negative for blurred vision and double vision.  Respiratory:  Negative for cough and shortness of breath.   Cardiovascular:  Negative for chest pain and palpitations.  Gastrointestinal:  Positive for heartburn and vomiting. Negative for abdominal pain, blood in stool, constipation, diarrhea and melena.       Dysphagia  Genitourinary:  Negative for dysuria and urgency.  Musculoskeletal:  Negative for joint pain and myalgias.  Skin:  Negative for itching and rash.  Neurological:  Negative for dizziness and headaches.  Psychiatric/Behavioral:  Negative for depression. The patient is not nervous/anxious.        Objective: BP 135/82   Pulse (!) 52   Temp 98.6 F (37 C)   Ht 5' 10 (1.778 m)   Wt 176 lb 9.6 oz (80.1 kg)   BMI 25.34 kg/m  Physical Exam Constitutional:      Appearance: Normal appearance.  HENT:     Head: Normocephalic and atraumatic.  Eyes:     Extraocular Movements: Extraocular movements intact.     Conjunctiva/sclera: Conjunctivae normal.  Cardiovascular:     Rate and Rhythm: Normal rate and regular rhythm.  Pulmonary:     Effort: Pulmonary effort is normal.     Breath sounds: Normal breath sounds.  Abdominal:     General: Bowel sounds are normal.     Palpations: Abdomen is soft.  Musculoskeletal:        General:  Normal range of motion.     Cervical back: Normal range of motion and neck supple.  Skin:    General: Skin is warm.  Neurological:     General: No focal deficit present.     Mental Status: He is alert and oriented to person, place, and time.  Psychiatric:        Mood and Affect: Mood normal.  Behavior: Behavior normal.      Assessment: *Esophageal dysphagia *Nausea and vomiting *Weight loss *Hiatal hernia *Abdominal pain  Plan: Discussed CT results in depth with patient today. Will schedule for EGD with possible esophageal dilation to evaluate for peptic ulcer disease, esophagitis, gastritis, H. Pylori, duodenitis, or other. Will also evaluate for esophageal stricture, Schatzki's ring, esophageal web or other.   The risks including infection, bleed, or perforation as well as benefits, limitations, alternatives and imponderables have been reviewed with the patient. Potential for esophageal dilation, biopsy, etc. have also been reviewed.  Questions have been answered. All parties agreeable.  May need to consider surgical referral for hernia repair depending on findings.  Continue on pantoprazole daily.  Patient to avoid tough textures. All meats should be chopped finely. Eat slowly, take small bites, chew thoroughly, and drink plenty of liquids throughout meals. Advised if something were to get hung in patient's esophagus and not come up or go down, they should proceed to the emergency room.  Thank you Dr. Maree for the kind referral 03/02/2024 8:56 AM

## 2024-03-03 NOTE — Telephone Encounter (Signed)
 Attempted to call pt back, no answer and VM not set up  Pt left vm returning call

## 2024-03-06 NOTE — Telephone Encounter (Signed)
 Pt left VM over the weekend. Called back, no answer and not able to leave VM

## 2024-03-07 NOTE — Telephone Encounter (Signed)
 Attempted to call pt, no answer and unable to leave vm

## 2024-03-08 ENCOUNTER — Encounter: Payer: Self-pay | Admitting: *Deleted

## 2024-03-08 NOTE — Telephone Encounter (Signed)
 Pt came into office. Scheduled for 11/17. Instructions provided

## 2024-03-10 ENCOUNTER — Encounter (HOSPITAL_COMMUNITY)
Admission: RE | Admit: 2024-03-10 | Discharge: 2024-03-10 | Disposition: A | Discharge status: Home / Self Care | Source: Ambulatory Visit | Attending: Internal Medicine | Admitting: Internal Medicine

## 2024-03-10 NOTE — Progress Notes (Signed)
Left message for patient to call back for pre-op instructions.  

## 2024-03-10 NOTE — Progress Notes (Signed)
 Attempted pre op phone call left message to call back.

## 2024-03-13 ENCOUNTER — Encounter (HOSPITAL_COMMUNITY)
Admission: RE | Disposition: A | Discharge status: Home / Self Care | Payer: Self-pay | Source: Home / Self Care | Attending: Internal Medicine

## 2024-03-13 ENCOUNTER — Ambulatory Visit (HOSPITAL_COMMUNITY): Admitting: Anesthesiology

## 2024-03-13 ENCOUNTER — Ambulatory Visit (HOSPITAL_COMMUNITY)
Admission: RE | Admit: 2024-03-13 | Discharge: 2024-03-13 | Disposition: A | Discharge status: Home / Self Care | Attending: Internal Medicine | Admitting: Internal Medicine

## 2024-03-13 ENCOUNTER — Encounter (HOSPITAL_COMMUNITY): Payer: Self-pay | Admitting: Internal Medicine

## 2024-03-13 DIAGNOSIS — K222 Esophageal obstruction: Secondary | ICD-10-CM | POA: Diagnosis not present

## 2024-03-13 DIAGNOSIS — I10 Essential (primary) hypertension: Secondary | ICD-10-CM | POA: Insufficient documentation

## 2024-03-13 DIAGNOSIS — K259 Gastric ulcer, unspecified as acute or chronic, without hemorrhage or perforation: Secondary | ICD-10-CM | POA: Diagnosis not present

## 2024-03-13 DIAGNOSIS — K449 Diaphragmatic hernia without obstruction or gangrene: Secondary | ICD-10-CM | POA: Diagnosis not present

## 2024-03-13 DIAGNOSIS — R634 Abnormal weight loss: Secondary | ICD-10-CM | POA: Diagnosis not present

## 2024-03-13 DIAGNOSIS — E1140 Type 2 diabetes mellitus with diabetic neuropathy, unspecified: Secondary | ICD-10-CM | POA: Insufficient documentation

## 2024-03-13 DIAGNOSIS — Z79899 Other long term (current) drug therapy: Secondary | ICD-10-CM | POA: Insufficient documentation

## 2024-03-13 DIAGNOSIS — I252 Old myocardial infarction: Secondary | ICD-10-CM | POA: Insufficient documentation

## 2024-03-13 DIAGNOSIS — R1314 Dysphagia, pharyngoesophageal phase: Secondary | ICD-10-CM | POA: Diagnosis not present

## 2024-03-13 DIAGNOSIS — I2510 Atherosclerotic heart disease of native coronary artery without angina pectoris: Secondary | ICD-10-CM

## 2024-03-13 DIAGNOSIS — Z8673 Personal history of transient ischemic attack (TIA), and cerebral infarction without residual deficits: Secondary | ICD-10-CM | POA: Diagnosis not present

## 2024-03-13 DIAGNOSIS — K2960 Other gastritis without bleeding: Secondary | ICD-10-CM | POA: Diagnosis not present

## 2024-03-13 DIAGNOSIS — E785 Hyperlipidemia, unspecified: Secondary | ICD-10-CM

## 2024-03-13 DIAGNOSIS — R1310 Dysphagia, unspecified: Secondary | ICD-10-CM

## 2024-03-13 DIAGNOSIS — E119 Type 2 diabetes mellitus without complications: Secondary | ICD-10-CM

## 2024-03-13 DIAGNOSIS — K219 Gastro-esophageal reflux disease without esophagitis: Secondary | ICD-10-CM

## 2024-03-13 HISTORY — PX: ESOPHAGOGASTRODUODENOSCOPY: SHX5428

## 2024-03-13 SURGERY — EGD (ESOPHAGOGASTRODUODENOSCOPY)
Anesthesia: General

## 2024-03-13 MED ORDER — LACTATED RINGERS IV SOLN: INTRAVENOUS | Status: DC | PRN

## 2024-03-13 MED ORDER — PROPOFOL 10 MG/ML IV BOLUS
INTRAVENOUS | Status: DC | PRN
Start: 2024-03-13 — End: 2024-03-13
  Administered 2024-03-13: 40 mg via INTRAVENOUS
  Administered 2024-03-13: 80 mg via INTRAVENOUS

## 2024-03-13 MED ORDER — LIDOCAINE HCL (CARDIAC) PF 100 MG/5ML IV SOSY
PREFILLED_SYRINGE | INTRAVENOUS | Status: DC | PRN
  Administered 2024-03-13: 50 mg via INTRAVENOUS

## 2024-03-13 MED ORDER — PANTOPRAZOLE SODIUM 40 MG PO TBEC: 40.0000 mg | DELAYED_RELEASE_TABLET | Freq: Two times a day (BID) | ORAL | 3 refills | Status: AC

## 2024-03-13 NOTE — Anesthesia Postprocedure Evaluation (Signed)
 Anesthesia Post Note  Patient: Albert [name]  Procedure(s) Performed: EGD (ESOPHAGOGASTRODUODENOSCOPY)  Patient location during evaluation: Phase II Anesthesia Type: General Level of consciousness: awake and alert Pain management: pain level controlled Vital Signs Assessment: post-procedure vital signs reviewed and stable Respiratory status: spontaneous breathing, nonlabored ventilation and respiratory function stable Cardiovascular status: stable Anesthetic complications: no   There were no known notable events for this encounter.   Last Vitals:  Vitals:   03/13/24 1333 03/13/24 1342  BP: (!) 100/57 118/75  Pulse: 78   Resp:    Temp: 37.3 C   SpO2: 100%     Last Pain:  Vitals:   03/13/24 1333  TempSrc: Oral  PainSc: 0-No pain                 Breshae Belcher L Antonina Deziel

## 2024-03-13 NOTE — Transfer of Care (Signed)
 Immediate Anesthesia Transfer of Care Note  Patient: Albert [name]  Procedure(s) Performed: EGD (ESOPHAGOGASTRODUODENOSCOPY)  Patient Location: Short Stay  Anesthesia Type:General  Level of Consciousness: awake  Airway & Oxygen Therapy: Patient Spontanous Breathing  Post-op Assessment: Report given to RN  Post vital signs: Reviewed and stable  Last Vitals:  Vitals Value Taken Time  BP    Temp    Pulse    Resp    SpO2      Last Pain:  Vitals:   03/13/24 1318  TempSrc:   PainSc: 0-No pain      Patients Stated Pain Goal: 6 (03/13/24 1245)  Complications: No notable events documented.

## 2024-03-13 NOTE — Op Note (Signed)
 [facility] Patient Name: Albert [name] Procedure Date: 03/13/2024 1:09 PM MRN: [id number] Date of Birth: Jul 06, 1945 Attending MD: Carlin POUR. Cindie , [place], [id number] CSN: [id number] Age: 78 Admit Type: Outpatient Procedure:                Upper GI endoscopy Indications:              Dysphagia, Heartburn Providers:                Carlin POUR. Cindie, DO, Tammy Vaught, RN, Chad                            Wilson, Technician Referring MD:              Medicines:                See the Anesthesia note for documentation of the                            administered medications Complications:            No immediate complications. Estimated Blood Loss:     Estimated blood loss was minimal. Procedure:                Pre-Anesthesia Assessment:                           - The anesthesia plan was to use monitored                            anesthesia care (MAC).                           After obtaining informed consent, the endoscope was                            passed under direct vision. Throughout the                            procedure, the patient's blood pressure, pulse, and                            oxygen saturations were monitored continuously. The                            [id number] ([id number]) Upper was introduced through                            the mouth, and advanced to the second part of                            duodenum. The upper GI endoscopy was accomplished                            without difficulty. The patient tolerated the                            procedure well. Scope In: 1:22:20 PM  Scope Out: 1:27:50 PM Total Procedure Duration: 0 hours 5 minutes 30 seconds  Findings:      A large hiatal hernia with a few Cameron ulcers was found.      A mild Schatzki ring was found in the distal esophagus. A TTS dilator       was passed through the scope. Dilation with a 15-16.5-18 mm balloon       dilator was performed to 18 mm. The dilation site was examined and        showed moderate improvement in luminal narrowing.      Patchy mild inflammation was found in the entire examined stomach.       Biopsies were taken with a cold forceps for Helicobacter pylori testing.      The duodenal bulb, first portion of the duodenum and second portion of       the duodenum were normal. Impression:               - Large hiatal hernia with a few Cameron ulcers.                           - Mild Schatzki ring. Dilated.                           - Gastritis. Biopsied.                           - Normal duodenal bulb, first portion of the                            duodenum and second portion of the duodenum. Moderate Sedation:      Per Anesthesia Care Recommendation:           - Patient has a contact number available for                            emergencies. The signs and symptoms of potential                            delayed complications were discussed with the                            patient. Return to normal activities tomorrow.                            Written discharge instructions were provided to the                            patient.                           - Resume previous diet.                           - Continue present medications.                           - Await pathology results.                           -  Repeat upper endoscopy PRN for retreatment.                           - Return to GI clinic in 3 months.                           - Use Protonix (pantoprazole) 40 mg PO BID. Procedure Code(s):        --- Professional ---                           [phone], Esophagogastroduodenoscopy, flexible,                            transoral; with transendoscopic balloon dilation of                            esophagus (less than 30 mm diameter)                           43239, 59, Esophagogastroduodenoscopy, flexible,                            transoral; with biopsy, single or multiple Diagnosis Code(s):        --- Professional ---                            K44.9, Diaphragmatic hernia without obstruction or                            gangrene                           K25.9, Gastric ulcer, unspecified as acute or                            chronic, without hemorrhage or perforation                           K22.2, Esophageal obstruction                           K29.70, Gastritis, unspecified, without bleeding                           R13.10, Dysphagia, unspecified                           R12, Heartburn CPT copyright 2022 American Medical Association. All rights reserved. The codes documented in this report are preliminary and upon coder review may  be revised to meet current compliance requirements. Carlin POUR. Cindie, DO Carlin POUR. Cindie, DO 03/13/2024 1:31:30 PM This report has been signed electronically. Number of Addenda: 0

## 2024-03-13 NOTE — Discharge Instructions (Signed)
 EGD Discharge instructions Please read the instructions outlined below and refer to this sheet in the next few weeks. These discharge instructions provide you with general information on caring for yourself after you leave the hospital. Your doctor may also give you specific instructions. While your treatment has been planned according to the most current medical practices available, unavoidable complications occasionally occur. If you have any problems or questions after discharge, please call your doctor. ACTIVITY You may resume your regular activity but move at a slower pace for the next 24 hours.  Take frequent rest periods for the next 24 hours.  Walking will help expel (get rid of) the air and reduce the bloated feeling in your abdomen.  No driving for 24 hours (because of the anesthesia (medicine) used during the test).  You may shower.  Do not sign any important legal documents or operate any machinery for 24 hours (because of the anesthesia used during the test).  NUTRITION Drink plenty of fluids.  You may resume your normal diet.  Begin with a light meal and progress to your normal diet.  Avoid alcoholic beverages for 24 hours or as instructed by your caregiver.  MEDICATIONS You may resume your normal medications unless your caregiver tells you otherwise.  WHAT YOU CAN EXPECT TODAY You may experience abdominal discomfort such as a feeling of fullness or "gas" pains.  FOLLOW-UP Your doctor will discuss the results of your test with you.  SEEK IMMEDIATE MEDICAL ATTENTION IF ANY OF THE FOLLOWING OCCUR: Excessive nausea (feeling sick to your stomach) and/or vomiting.  Severe abdominal pain and distention (swelling).  Trouble swallowing.  Temperature over 101 F (37.8 C).  Rectal bleeding or vomiting of blood.   Your EGD revealed mild amount inflammation in your stomach.  I took biopsies of this to rule out infection with a bacteria called H. pylori.  Await pathology results, my  office will contact you.  You also have a large hiatal hernia as well as tightening of your esophagus called a Schatzki's ring.  I stretched this out today.  Small bowel was normal.  I am going to increase your pantoprazole to twice daily and have sent this to your mail-in pharmacy..  Follow-up in GI office in 2 to 3 months.   I hope you have a great rest of your week!  Albert [name]. Albert [name], D.O. Gastroenterology and Hepatology [facility]

## 2024-03-13 NOTE — Interval H&P Note (Signed)
 History and Physical Interval Note:  03/13/2024 12:53 PM  Albert [name]  has presented today for surgery, with the diagnosis of dysphagia, gerd, nausea, vomiting, abd pain.  The various methods of treatment have been discussed with the patient and family. After consideration of risks, benefits and other options for treatment, the patient has consented to  Procedure(s) with comments: EGD (ESOPHAGOGASTRODUODENOSCOPY) (N/A) - 130pm, asa 3 as a surgical intervention.  The patient's history has been reviewed, patient examined, no change in status, stable for surgery.  I have reviewed the patient's chart and labs.  Questions were answered to the patient's satisfaction.     Albert [name]

## 2024-03-13 NOTE — Anesthesia Preprocedure Evaluation (Addendum)
 Anesthesia Evaluation  Patient identified by MRN, date of birth, ID band Patient awake    Reviewed: Allergy & Precautions, H&P , NPO status , Patient's Chart, lab work & pertinent test results, reviewed documented beta blocker date and time   Airway Mallampati: II  TM Distance: >3 FB Neck ROM: full    Dental  (+) Edentulous Upper, Edentulous Lower   Pulmonary neg pulmonary ROS   Pulmonary exam normal breath sounds clear to auscultation       Cardiovascular Exercise Tolerance: Good hypertension, + CAD and + Past MI  Normal cardiovascular exam Rhythm:regular Rate:Normal     Neuro/Psych  PSYCHIATRIC DISORDERS Anxiety Depression    Spinal stenosis. neuropathy  Neuromuscular disease CVA    GI/Hepatic Neg liver ROS,GERD  ,,  Endo/Other  diabetes, Type 2    Renal/GU negative Renal ROS  negative genitourinary   Musculoskeletal   Abdominal   Peds  Hematology negative hematology ROS (+)   Anesthesia Other Findings   Reproductive/Obstetrics negative OB ROS                              Anesthesia Physical Anesthesia Plan  ASA: 3  Anesthesia Plan: General   Post-op Pain Management: Minimal or no pain anticipated   Induction:   PONV Risk Score and Plan: Propofol infusion  Airway Management Planned: Natural Airway and Nasal Cannula  Additional Equipment: None  Intra-op Plan:   Post-operative Plan:   Informed Consent: I have reviewed the patients History and Physical, chart, labs and discussed the procedure including the risks, benefits and alternatives for the proposed anesthesia with the patient or authorized representative who has indicated his/her understanding and acceptance.     Dental Advisory Given  Plan Discussed with: CRNA  Anesthesia Plan Comments:          Anesthesia Quick Evaluation

## 2024-03-15 LAB — SURGICAL PATHOLOGY

## 2024-03-17 ENCOUNTER — Encounter (HOSPITAL_COMMUNITY): Payer: Self-pay | Admitting: Internal Medicine

## 2024-03-18 ENCOUNTER — Emergency Department (HOSPITAL_COMMUNITY)

## 2024-03-18 ENCOUNTER — Other Ambulatory Visit: Payer: Self-pay

## 2024-03-18 ENCOUNTER — Emergency Department (HOSPITAL_COMMUNITY)
Admission: EM | Admit: 2024-03-18 | Discharge: 2024-03-19 | Disposition: A | Discharge status: Home / Self Care | Attending: Emergency Medicine | Admitting: Emergency Medicine

## 2024-03-18 ENCOUNTER — Encounter (HOSPITAL_COMMUNITY): Payer: Self-pay

## 2024-03-18 DIAGNOSIS — Z8673 Personal history of transient ischemic attack (TIA), and cerebral infarction without residual deficits: Secondary | ICD-10-CM | POA: Diagnosis not present

## 2024-03-18 DIAGNOSIS — R1013 Epigastric pain: Secondary | ICD-10-CM

## 2024-03-18 DIAGNOSIS — E1140 Type 2 diabetes mellitus with diabetic neuropathy, unspecified: Secondary | ICD-10-CM | POA: Diagnosis not present

## 2024-03-18 DIAGNOSIS — Z79899 Other long term (current) drug therapy: Secondary | ICD-10-CM | POA: Diagnosis not present

## 2024-03-18 DIAGNOSIS — K449 Diaphragmatic hernia without obstruction or gangrene: Secondary | ICD-10-CM | POA: Diagnosis not present

## 2024-03-18 DIAGNOSIS — Z7982 Long term (current) use of aspirin: Secondary | ICD-10-CM | POA: Diagnosis not present

## 2024-03-18 DIAGNOSIS — I10 Essential (primary) hypertension: Secondary | ICD-10-CM | POA: Diagnosis not present

## 2024-03-18 DIAGNOSIS — R079 Chest pain, unspecified: Secondary | ICD-10-CM

## 2024-03-18 DIAGNOSIS — I700 Atherosclerosis of aorta: Secondary | ICD-10-CM | POA: Insufficient documentation

## 2024-03-18 DIAGNOSIS — I2510 Atherosclerotic heart disease of native coronary artery without angina pectoris: Secondary | ICD-10-CM | POA: Insufficient documentation

## 2024-03-18 DIAGNOSIS — K5730 Diverticulosis of large intestine without perforation or abscess without bleeding: Secondary | ICD-10-CM | POA: Diagnosis not present

## 2024-03-18 DIAGNOSIS — R112 Nausea with vomiting, unspecified: Secondary | ICD-10-CM

## 2024-03-18 DIAGNOSIS — K5790 Diverticulosis of intestine, part unspecified, without perforation or abscess without bleeding: Secondary | ICD-10-CM

## 2024-03-18 LAB — CBC WITH DIFFERENTIAL/PLATELET
Abs Immature Granulocytes: 0.02 K/uL (ref 0.00–0.07)
Basophils Absolute: 0 K/uL (ref 0.0–0.1)
Basophils Relative: 0 %
Eosinophils Absolute: 0.2 K/uL (ref 0.0–0.5)
Eosinophils Relative: 2 %
HCT: 42.1 % (ref 39.0–52.0)
Hemoglobin: 13.7 g/dL (ref 13.0–17.0)
Immature Granulocytes: 0 %
Lymphocytes Relative: 15 %
Lymphs Abs: 1.4 K/uL (ref 0.7–4.0)
MCH: 29 pg (ref 26.0–34.0)
MCHC: 32.5 g/dL (ref 30.0–36.0)
MCV: 89 fL (ref 80.0–100.0)
Monocytes Absolute: 0.8 K/uL (ref 0.1–1.0)
Monocytes Relative: 8 %
Neutro Abs: 6.8 K/uL (ref 1.7–7.7)
Neutrophils Relative %: 75 %
Platelets: 216 K/uL (ref 150–400)
RBC: 4.73 MIL/uL (ref 4.22–5.81)
RDW: 13.2 % (ref 11.5–15.5)
WBC: 9.2 K/uL (ref 4.0–10.5)
nRBC: 0 % (ref 0.0–0.2)

## 2024-03-18 LAB — I-STAT CHEM 8, ED
BUN: 35 mg/dL — ABNORMAL HIGH (ref 8–23)
Calcium, Ion: 1.19 mmol/L (ref 1.15–1.40)
Chloride: 100 mmol/L (ref 98–111)
Creatinine, Ser: 1.6 mg/dL — ABNORMAL HIGH (ref 0.61–1.24)
Glucose, Bld: 123 mg/dL — ABNORMAL HIGH (ref 70–99)
HCT: 43 % (ref 39.0–52.0)
Hemoglobin: 14.6 g/dL (ref 13.0–17.0)
Potassium: 3.9 mmol/L (ref 3.5–5.1)
Sodium: 138 mmol/L (ref 135–145)
TCO2: 28 mmol/L (ref 22–32)

## 2024-03-18 LAB — COMPREHENSIVE METABOLIC PANEL WITH GFR
ALT: 26 U/L (ref 0–44)
AST: 24 U/L (ref 15–41)
Albumin: 4.3 g/dL (ref 3.5–5.0)
Alkaline Phosphatase: 62 U/L (ref 38–126)
Anion gap: 10 (ref 5–15)
BUN: 31 mg/dL — ABNORMAL HIGH (ref 8–23)
CO2: 28 mmol/L (ref 22–32)
Calcium: 9.3 mg/dL (ref 8.9–10.3)
Chloride: 98 mmol/L (ref 98–111)
Creatinine, Ser: 1.36 mg/dL — ABNORMAL HIGH (ref 0.61–1.24)
GFR, Estimated: 54 mL/min — ABNORMAL LOW (ref >60)
Glucose, Bld: 124 mg/dL — ABNORMAL HIGH (ref 70–99)
Potassium: 3.9 mmol/L (ref 3.5–5.1)
Sodium: 137 mmol/L (ref 135–145)
Total Bilirubin: 0.3 mg/dL (ref 0.0–1.2)
Total Protein: 7.3 g/dL (ref 6.5–8.1)

## 2024-03-18 LAB — TROPONIN T, HIGH SENSITIVITY: Troponin T High Sensitivity: <15 ng/L (ref 0–19)

## 2024-03-18 LAB — LIPASE, BLOOD: Lipase: 43 U/L (ref 11–51)

## 2024-03-18 MED ORDER — PANTOPRAZOLE SODIUM 40 MG IV SOLR
40.0000 mg | Freq: Once | INTRAVENOUS | Status: AC
  Administered 2024-03-18: 40 mg via INTRAVENOUS
  Filled 2024-03-18: qty 10

## 2024-03-18 MED ORDER — ALUM & MAG HYDROXIDE-SIMETH 200-200-20 MG/5ML PO SUSP
30.0000 mL | Freq: Once | ORAL | Status: AC
  Administered 2024-03-18: 30 mL via ORAL
  Filled 2024-03-18: qty 30

## 2024-03-18 MED ORDER — IOHEXOL 300 MG/ML  SOLN
80.0000 mL | Freq: Once | INTRAMUSCULAR | Status: AC | PRN
  Administered 2024-03-18: 80 mL via INTRAVENOUS

## 2024-03-18 MED ORDER — LIDOCAINE VISCOUS HCL 2 % MT SOLN
15.0000 mL | Freq: Once | OROMUCOSAL | Status: AC
  Administered 2024-03-18: 15 mL via OROMUCOSAL
  Filled 2024-03-18: qty 15

## 2024-03-18 MED ORDER — SODIUM CHLORIDE 0.9 % IV BOLUS
500.0000 mL | Freq: Once | INTRAVENOUS | Status: AC
  Administered 2024-03-18: 500 mL via INTRAVENOUS

## 2024-03-18 MED ORDER — SUCRALFATE 1 GM/10ML PO SUSP
1.0000 g | Freq: Once | ORAL | Status: AC
  Administered 2024-03-18: 1 g via ORAL
  Filled 2024-03-18: qty 10

## 2024-03-18 MED ORDER — ONDANSETRON HCL 4 MG PO TABS: 4.0000 mg | ORAL_TABLET | Freq: Three times a day (TID) | ORAL | 0 refills | Status: AC | PRN

## 2024-03-18 MED ORDER — ONDANSETRON HCL 4 MG/2ML IJ SOLN
4.0000 mg | Freq: Once | INTRAMUSCULAR | Status: AC
  Administered 2024-03-18: 4 mg via INTRAVENOUS
  Filled 2024-03-18: qty 2

## 2024-03-18 MED ORDER — SUCRALFATE 1 G PO TABS: 1.0000 g | ORAL_TABLET | Freq: Three times a day (TID) | ORAL | 0 refills | Status: AC

## 2024-03-18 NOTE — ED Triage Notes (Signed)
 Pt said he was laying in bed and rolled over to his left side when he felt like he was going to throw up in his throat. Pt said he tried to eating something but can not keep anything down. Was seen by endoscopy recently.Zofran , nitroglycerin, aspirin given prior to arrival by EMS.

## 2024-03-18 NOTE — ED Provider Notes (Signed)
 [facility] AT [facility] Provider Note  CSN: [id number] Arrival date & time: 03/18/24 2139  Chief Complaint(s) Chest Pain and n/v/d  HPI Albert [name] is a 78 y.o. male with past medical history as below, significant for anxiety, depression, DM 2, hypertension hyperlipidemia, MI who presents to the ED with complaint of chest pain, nausea vomiting diarrhea  Patient reports diarrhea ongoing last couple days, stool was brown, no blood or melanotic stool reported.  Having some nausea vomiting.  This evening he was relaxing in bed, he rolled over onto his left side began having severe chest pain, sharp, stabbing midsternal.  Worsening nausea vomiting.  Vomited multiple times.  Not believe there was any blood in his vomit.  He still having ongoing nausea, right sided midsternal chest pain, sharp and stabbing.  Does not radiate.  Having some epigastric discomfort as well.  Reports this has occurred in the past associated with GI illness.  No significant difficulty breathing.  No fevers but does report some chills today.  No change to urination.  No sick contacts.  Recent diet or medication changes.  No suspicious p.o. intake.  Past Medical History Past Medical History:  Diagnosis Date   Anxiety    Depression    Diabetes (HCC)    type 2   Dizziness    GERD (gastroesophageal reflux disease)    Heart murmur    Hyperlipidemia    Hypertension    Myocardial infarction [facility]) 2008   Neuropathy    left foot   Spinal stenosis    Stroke [facility]) 2008   Patient Active Problem List   Diagnosis Date Noted   At high risk for falls 01/25/2015   BMI 26.0-26.9,adult 11/23/2014   GERD (gastroesophageal reflux disease) 12/07/2013   Essential hypertension, benign 12/07/2013   Hyperlipidemia with target LDL less than 100 12/07/2013   Depression 12/07/2013   Generalized anxiety disorder 12/07/2013   CAD (coronary artery disease), native coronary artery 12/07/2013   Home  Medication(s) Prior to Admission medications   Medication Sig Start Date End Date Taking? Authorizing Provider  ondansetron  (ZOFRAN ) 4 MG tablet Take 1 tablet (4 mg total) by mouth every 8 (eight) hours as needed for nausea or vomiting. 03/18/24  Yes Elnor Savant A, DO  sucralfate  (CARAFATE ) 1 g tablet Take 1 tablet (1 g total) by mouth with breakfast, with lunch, and with evening meal for 7 days. 03/18/24 03/25/24 Yes Elnor Savant A, DO  amLODipine  (NORVASC ) 10 MG tablet Take 1 tablet (10 mg total) by mouth daily. 04/23/15   Gladis Mary-Margaret, FNP  aspirin 81 MG tablet Take 160 mg by mouth 2 (two) times daily.     [provider]  gabapentin (NEURONTIN) 100 MG capsule Take 100 mg by mouth 3 (three) times daily.    [provider]  lisinopril  (PRINIVIL ,ZESTRIL ) 10 MG tablet TAKE 1 TABLET EVERY DAY  (DOSE INCREASE 10/6 AND DISCONTINUE ATENOLOL ) 07/17/15   Alvan Dorn FALCON, MD  LORazepam  (ATIVAN ) 0.5 MG tablet Take 1 tablet (0.5 mg total) by mouth 2 (two) times daily. 06/30/16   Vickey Mettle, MD  Misc. Devices (QUAD CANE) MISC Use to balance and to prevent falls (Dx R29.6 - high fall risk) 01/25/15   Gladis Mary-Margaret, FNP  Multiple Vitamins-Minerals (MULTIVITAMIN ADULT PO) Take 1 tablet by mouth daily. 10/01/15   [provider]  ondansetron  (ZOFRAN ) 4 MG tablet 4 mg as needed. 11/10/18   [provider]  pantoprazole  (PROTONIX ) 40 MG tablet  Take 1 tablet (40 mg total) by mouth 2 (two) times daily. 03/13/24 03/13/25  Cindie Carlin POUR, DO  pravastatin  (PRAVACHOL ) 40 MG tablet Take 1 tablet (40 mg total) by mouth daily. 04/23/15   Gladis Mary-Margaret, FNP  sertraline  (ZOLOFT ) 100 MG tablet Take 2 tablets (200 mg total) by mouth daily. 06/30/16   Hisada, Reina, MD  temazepam (RESTORIL) 15 MG capsule 15 mg daily. 08/16/18   [provider]  TYLENOL 500 MG tablet Take 1 tablet by mouth 2 (two) times daily. 08/26/14   [provider]                                                                                                                                     Past Surgical History Past Surgical History:  Procedure Laterality Date   APPENDECTOMY  1980   ESOPHAGOGASTRODUODENOSCOPY N/A 03/13/2024   Procedure: EGD (ESOPHAGOGASTRODUODENOSCOPY);  Surgeon: Cindie Carlin POUR, DO;  Location: [facility];  Service: Endoscopy;  Laterality: N/A;  130pm, asa 3   Family History Family History  Problem Relation Age of Onset   Heart disease Mother    Hypertension Mother    Heart attack Mother    Cancer Mother    Heart disease Father    Heart attack Father    Hypertension Brother    Heart disease Brother    Heart attack Sister    Heart attack Sister     Social History Social History   Tobacco Use   Smoking status: Never   Smokeless tobacco: Never  Substance Use Topics   Alcohol  use: No    Alcohol /week: 0.0 standard drinks of alcohol    Drug use: No   Allergies Ciprofloxacin, Diovan [valsartan], Hydrocodone, and Penicillins  Review of Systems A thorough review of systems was obtained and all systems are negative except as noted in the HPI and PMH.   Physical Exam Vital Signs  I have reviewed the triage vital signs BP (!) 142/97   Pulse 73   Temp 98.4 F (36.9 C) (Oral)   Resp 18   Ht 5' 10 (1.778 m)   Wt 80.1 kg   SpO2 93%   BMI 25.34 kg/m  Physical Exam Vitals and nursing note reviewed.  Constitutional:      General: He is not in acute distress.    Appearance: He is well-developed.  HENT:     Head: Normocephalic and atraumatic.     Right Ear: External ear normal.     Left Ear: External ear normal.     Mouth/Throat:     Mouth: Mucous membranes are moist.  Eyes:     General: No scleral icterus. Cardiovascular:     Rate and Rhythm: Normal rate and regular rhythm.     Pulses: Normal pulses.     Heart sounds: Normal heart sounds.  Pulmonary:     Effort: Pulmonary effort is normal. No respiratory distress.  Breath sounds: Normal breath sounds.  Chest:    Abdominal:     General: Abdomen is flat.     Palpations: Abdomen is soft.     Tenderness: There is abdominal tenderness in the epigastric area. Negative signs include Murphy's sign.   Musculoskeletal:     Cervical back: No rigidity.     Right lower leg: No edema.     Left lower leg: No edema.  Skin:    General: Skin is warm and dry.     Capillary Refill: Capillary refill takes less than 2 seconds.  Neurological:     Mental Status: He is alert.  Psychiatric:        Mood and Affect: Mood normal.        Behavior: Behavior normal.     ED Results and Treatments Labs (all labs ordered are listed, but only abnormal results are displayed) Labs Reviewed  COMPREHENSIVE METABOLIC PANEL WITH GFR - Abnormal; Notable for the following components:      Result Value   Glucose, Bld 124 (*)    BUN 31 (*)    Creatinine, Ser 1.36 (*)    GFR, Estimated 54 (*)    All other components within normal limits  I-STAT CHEM 8, ED - Abnormal; Notable for the following components:   BUN 35 (*)    Creatinine, Ser 1.60 (*)    Glucose, Bld 123 (*)    All other components within normal limits  RESP PANEL BY RT-PCR (RSV, FLU A&B, COVID)  RVPGX2  LIPASE, BLOOD  CBC WITH DIFFERENTIAL/PLATELET  TROPONIN T, HIGH SENSITIVITY  TROPONIN T, HIGH SENSITIVITY                                                                                                                          Radiology CT CHEST WO CONTRAST Result Date: 03/18/2024 CLINICAL DATA:  Vomiting, chest pain EXAM: CT CHEST WITHOUT CONTRAST TECHNIQUE: Multidetector CT imaging of the chest was performed following the standard protocol without IV contrast. RADIATION DOSE REDUCTION: This exam was performed according to the departmental dose-optimization program which includes automated exposure control, adjustment of the mA and/or kV according to patient size and/or use of iterative reconstruction technique.  COMPARISON:  03/18/2024 FINDINGS: Cardiovascular: Unenhanced imaging of the heart is unremarkable without pericardial effusion. Normal caliber of the thoracic aorta. Atherosclerosis of the aorta and coronary vasculature. Assessment of the vascular lumen cannot be performed without intravenous contrast. Mediastinum/Nodes: No enlarged mediastinal or axillary lymph nodes. Thyroid  gland, trachea, and esophagus demonstrate no significant findings. There is a large hiatal hernia containing a portion of the mid transverse colon and a significant portion of the stomach. No evidence of bowel wall thickening. Lungs/Pleura: Compressive atelectasis within the left lower lobe due to the large hiatal hernia. No acute airspace disease, effusion, or pneumothorax. Central airways are patent. Upper Abdomen: No acute abnormality. Musculoskeletal: No acute or destructive bony abnormalities. Reconstructed images demonstrate no additional findings. IMPRESSION: 1. Large hiatal hernia  containing a portion of the mid transverse colon as well as a large portion of the stomach. No evidence of bowel obstruction. 2. Compressive atelectasis left lower lobe. No acute airspace disease. 3. Aortic Atherosclerosis (ICD10-I70.0). Coronary artery atherosclerosis. Electronically Signed   By: Ozell Daring M.D.   On: 03/18/2024 22:57   CT ABDOMEN PELVIS W CONTRAST Result Date: 03/18/2024 CLINICAL DATA:  Epigastric pain, vomiting EXAM: CT ABDOMEN AND PELVIS WITH CONTRAST TECHNIQUE: Multidetector CT imaging of the abdomen and pelvis was performed using the standard protocol following bolus administration of intravenous contrast. RADIATION DOSE REDUCTION: This exam was performed according to the departmental dose-optimization program which includes automated exposure control, adjustment of the mA and/or kV according to patient size and/or use of iterative reconstruction technique. CONTRAST:  80mL OMNIPAQUE  IOHEXOL  300 MG/ML  SOLN COMPARISON:   01/03/2024 FINDINGS: Lower chest: Large hiatal hernia containing a significant portion of the stomach. Compressive atelectasis left lower lobe. Hepatobiliary: No focal liver abnormality is seen. No gallstones, gallbladder wall thickening, or biliary dilatation. Pancreas: Unremarkable. No pancreatic ductal dilatation or surrounding inflammatory changes. Spleen: Normal in size without focal abnormality. Adrenals/Urinary Tract: Stable renal cortical atrophy. No urinary tract calculi or obstructive uropathy. There is renals and bladder are unremarkable. Stomach/Bowel: No bowel obstruction or ileus. Diverticulosis of the descending and sigmoid colon without evidence of acute diverticulitis. No bowel wall thickening or inflammatory change. Large hiatal hernia containing a significant portion of the stomach. Vascular/Lymphatic: Aortic atherosclerosis. No enlarged abdominal or pelvic lymph nodes. Reproductive: Prostate is unremarkable. Other: No free fluid or free intraperitoneal gas. No abdominal wall hernia. Musculoskeletal: No acute or destructive bony abnormalities. Bilateral L5 spondylolysis. Reconstructed images demonstrate no additional findings. IMPRESSION: 1. Large hiatal hernia containing a significant portion of the stomach. 2. No acute intra-abdominal or intrapelvic process. 3. Distal colonic diverticulosis without diverticulitis. 4.  Aortic Atherosclerosis (ICD10-I70.0). Electronically Signed   By: Ozell Daring M.D.   On: 03/18/2024 22:54   DG Chest Port 1 View Result Date: 03/18/2024 CLINICAL DATA:  Chest pain, nausea EXAM: PORTABLE CHEST 1 VIEW COMPARISON:  12/07/2013, 01/03/2024 FINDINGS: Single frontal view of the chest demonstrates an unremarkable cardiac silhouette. Large hiatal hernia is again noted containing significant portion of the stomach. No airspace disease, effusion, or pneumothorax. No acute bony abnormalities. IMPRESSION: 1. Large hiatal hernia. 2. No acute airspace disease.  Electronically Signed   By: Ozell Daring M.D.   On: 03/18/2024 22:33    Pertinent labs & imaging results that were available during my care of the patient were reviewed by me and considered in my medical decision making (see MDM for details).  Medications Ordered in ED Medications  alum & mag hydroxide-simeth (MAALOX/MYLANTA) 200-200-20 MG/5ML suspension 30 mL (30 mLs Oral Given 03/18/24 2203)  ondansetron  (ZOFRAN ) injection 4 mg (4 mg Intravenous Given 03/18/24 2201)  sodium chloride  0.9 % bolus 500 mL (0 mLs Intravenous Stopped 03/18/24 2333)  iohexol  (OMNIPAQUE ) 300 MG/ML solution 80 mL (80 mLs Intravenous Contrast Given 03/18/24 2225)  pantoprazole  (PROTONIX ) injection 40 mg (40 mg Intravenous Given 03/18/24 2327)  sucralfate  (CARAFATE ) 1 GM/10ML suspension 1 g (1 g Oral Given 03/18/24 2344)  lidocaine  (XYLOCAINE ) 2 % viscous mouth solution 15 mL (15 mLs Mouth/Throat Given 03/18/24 2344)  Procedures Procedures  (including critical care time)  Medical Decision Making / ED Course    Medical Decision Making:    Albert [name] is a 79 y.o. male with past medical history as below, significant for anxiety, depression, DM 2, hypertension hyperlipidemia, MI who presents to the ED with complaint of chest pain, nausea vomiting diarrhea. The complaint involves an extensive differential diagnosis and also carries with it a high risk of complications and morbidity.  Serious etiology was considered. Ddx includes but is not limited to: Differential includes all life-threatening causes for chest pain. This includes but is not exclusive to acute coronary syndrome, aortic dissection, pulmonary embolism, cardiac tamponade, community-acquired pneumonia, pericarditis, musculoskeletal chest wall pain, etc. Differential diagnosis includes but is not exclusive to acute  cholecystitis, intrathoracic causes for epigastric abdominal pain, gastritis, duodenitis, pancreatitis, small bowel or large bowel obstruction, abdominal aortic aneurysm, hernia, gastritis, etc.   Complete initial physical exam performed, notably the patient was in acute distress.    Reviewed and confirmed nursing documentation for past medical history, family history, social history.  Vital signs reviewed.    Chest pain Epigastric abdominal pain Nausea, vomiting, diarrhea> - Patient with acute onset chest pain associated with nausea vomiting.  Also epigastric pain. - EKG stable - labs stable, initial trop <15, pending delta given acute onset of his cp - CTAP/CT chest w/ large hiatal hernia >> f/w dr cindie GI >> upper endo on 11/17 -large hiatal hernia, mild Schatzki ring, gastritis, normal duodenum o/w per dr carver's note - CXR stable - pt reports ongoing improvement to his symptoms - favor his pain today likely GI in nature - handoff to incoming EDP pending delta trop and recheck, anticipate discharge if trop wnl and symptoms continue to improve  Clinical Course as of 03/18/24 2357  Sat Mar 18, 2024  2319 Symptoms somewhat improved [SG]  2336 CT shows large hiatal hernia, pt reports known hx of this. This is likely at least in some part provoking his symptoms today [SG]    Clinical Course User Index [SG] Elnor Jayson LABOR, DO                    Additional history obtained: -Additional history obtained from ems -External records from outside source obtained and reviewed including: Chart review including previous notes, labs, imaging, consultation notes including  Prior gi documentation Home meds allergies   Lab Tests: -I ordered, reviewed, and interpreted labs.   The pertinent results include:   Labs Reviewed  COMPREHENSIVE METABOLIC PANEL WITH GFR - Abnormal; Notable for the following components:      Result Value   Glucose, Bld 124 (*)    BUN 31 (*)     Creatinine, Ser 1.36 (*)    GFR, Estimated 54 (*)    All other components within normal limits  I-STAT CHEM 8, ED - Abnormal; Notable for the following components:   BUN 35 (*)    Creatinine, Ser 1.60 (*)    Glucose, Bld 123 (*)    All other components within normal limits  RESP PANEL BY RT-PCR (RSV, FLU A&B, COVID)  RVPGX2  LIPASE, BLOOD  CBC WITH DIFFERENTIAL/PLATELET  TROPONIN T, HIGH SENSITIVITY  TROPONIN T, HIGH SENSITIVITY    Notable for labs stable thus far  EKG   EKG Interpretation Date/Time:  Saturday March 18 2024 21:46:14 EST Ventricular Rate:  86 PR Interval:  183 QRS Duration:  81 QT Interval:  392 QTC Calculation: 469 R Axis:  23  Text Interpretation: Sinus rhythm similar to prior no stemi Confirmed by Elnor Savant ([id number]) on 03/18/2024 11:12:34 PM         Imaging Studies ordered: I ordered imaging studies including ctap, ct chest, cxr I independently visualized the following imaging with scope of interpretation limited to determining acute life threatening conditions related to emergency care; findings noted above I agree with the radiologist interpretation If any imaging was obtained with contrast I closely monitored patient for any possible adverse reaction a/w contrast administration in the emergency department   Medicines ordered and prescription drug management: Meds ordered this encounter  Medications   alum & mag hydroxide-simeth (MAALOX/MYLANTA) 200-200-20 MG/5ML suspension 30 mL   ondansetron  (ZOFRAN ) injection 4 mg   sodium chloride  0.9 % bolus 500 mL   iohexol  (OMNIPAQUE ) 300 MG/ML solution 80 mL   pantoprazole  (PROTONIX ) injection 40 mg   sucralfate  (CARAFATE ) 1 GM/10ML suspension 1 g   lidocaine  (XYLOCAINE ) 2 % viscous mouth solution 15 mL   sucralfate  (CARAFATE ) 1 g tablet    Sig: Take 1 tablet (1 g total) by mouth with breakfast, with lunch, and with evening meal for 7 days.    Dispense:  21 tablet    Refill:  0   ondansetron   (ZOFRAN ) 4 MG tablet    Sig: Take 1 tablet (4 mg total) by mouth every 8 (eight) hours as needed for nausea or vomiting.    Dispense:  6 tablet    Refill:  0    -I have reviewed the patients home medicines and have made adjustments as needed   Consultations Obtained: na   Cardiac Monitoring: The patient was maintained on a cardiac monitor.  I personally viewed and interpreted the cardiac monitored which showed an underlying rhythm of: nsr Continuous pulse oximetry interpreted by myself, 95% on RA.    Social Determinants of Health:  Diagnosis or treatment significantly limited by social determinants of health: na   Reevaluation: After the interventions noted above, I reevaluated the patient and found that they have improved  Co morbidities that complicate the patient evaluation  Past Medical History:  Diagnosis Date   Anxiety    Depression    Diabetes (HCC)    type 2   Dizziness    GERD (gastroesophageal reflux disease)    Heart murmur    Hyperlipidemia    Hypertension    Myocardial infarction [facility]) 2008   Neuropathy    left foot   Spinal stenosis    Stroke [facility]) 2008      Dispostion: Disposition decision including need for hospitalization was considered, and patient disposition pending at time of sign out.    Final Clinical Impression(s) / ED Diagnoses Final diagnoses:  Hiatal hernia  Diverticulosis  Aortic atherosclerosis  Nausea vomiting and diarrhea  Chest pain, unspecified type        Elnor Savant LABOR, DO 03/18/24 2357

## 2024-03-18 NOTE — Discharge Instructions (Addendum)
 It was a pleasure caring for you today in the emergency department.  Please follow-up with gastroenterology for further evaluation and care of your hiatal hernia, gastritis  Please return to the emergency department for any worsening or worrisome symptoms.

## 2024-03-19 LAB — RESP PANEL BY RT-PCR (RSV, FLU A&B, COVID)  RVPGX2
Influenza A by PCR: NEGATIVE
Influenza B by PCR: NEGATIVE
Resp Syncytial Virus by PCR: NEGATIVE
SARS Coronavirus 2 by RT PCR: NEGATIVE

## 2024-03-19 LAB — TROPONIN T, HIGH SENSITIVITY: Troponin T High Sensitivity: <15 ng/L (ref 0–19)

## 2024-03-19 NOTE — ED Provider Notes (Signed)
 Patient signed out pending repeat troponin.  Presents with atypical chest pain.  Most consistent with reflux and hiatal hernia.  Symptoms worse with eating.  Initial workup is reassuring including troponin.  Repeat troponin is negative as well.  Patient is tolerating p.o.  This does seem to exacerbate his symptoms however.  Will discharge per Dr. Kevin plan of Carafate  and Zofran .  Had an EGD just last week and follows with Dr. Cindie.   Albert Charmaine FALCON, MD 03/19/24 0110

## 2024-03-21 ENCOUNTER — Telehealth: Payer: Self-pay

## 2024-03-21 NOTE — Telephone Encounter (Signed)
 FYI:  Spoke with Waddell from [facility] and was advised the pt was in her office right now. Pt has made a few trips to the ED for nausea and vomiting. She states the pt cannot do anything without vomiting. She is prescribing for him 12.5 Phenergan suppositories because the Carafate  and Zofran  he was proscribed is not working. She is also referring the pt to [facility] for the large hernia he has. She states she will just go ahead and do this now because this pt is very sick.  (Sent to Dr Cindie in secure chat).

## 2024-03-22 NOTE — Telephone Encounter (Signed)
(  From secure chat)  Per Dr Cindie that was ok for the pt's PCP to refer pt and to prescribed medication.

## 2024-04-05 ENCOUNTER — Telehealth: Payer: Self-pay | Admitting: Cardiology

## 2024-04-05 ENCOUNTER — Other Ambulatory Visit: Payer: Self-pay | Admitting: General Surgery

## 2024-04-05 DIAGNOSIS — R1310 Dysphagia, unspecified: Secondary | ICD-10-CM

## 2024-04-05 DIAGNOSIS — K449 Diaphragmatic hernia without obstruction or gangrene: Secondary | ICD-10-CM

## 2024-04-05 NOTE — Telephone Encounter (Signed)
° °  Pre-operative Risk Assessment    Patient Name: Albert [name]  DOB: [birth year] MRN: [id number]      Request for Surgical Clearance    Procedure:  hiatal hernia repair  Date of Surgery:  Clearance TBD                                 Surgeon:  TBD Surgeon's Group or Practice Name:  [facility] Phone number:  [phone] Fax number:  [phone]   Type of Clearance Requested:   - Medical    Type of Anesthesia:  General    Additional requests/questions:  Please fax a copy of clearance to the surgeon's office.  Signed, Darryle GORMAN Glance   04/05/2024, 12:57 PM

## 2024-04-05 NOTE — Telephone Encounter (Signed)
° °  Name: Albert [name]  DOB: [birth year]  MRN: [id number]  Primary Cardiologist: None  Chart reviewed as part of pre-operative protocol coverage. Because of Albert [name]'s past medical history and time since last visit, he will require a follow-up in-office visit in order to better assess preoperative cardiovascular risk.  Pre-op covering staff: - Please schedule appointment and call patient to inform them. If patient already had an upcoming appointment within acceptable timeframe, please add pre-op clearance to the appointment notes so provider is aware. - Please contact requesting surgeon's office via preferred method (i.e, phone, fax) to inform them of need for appointment prior to surgery.  Patient has not been seen by [facility] since 2017. Will need a new patient appointment with an MD.   Rollo FABIENE Louder, PA-C  04/05/2024, 1:08 PM

## 2024-04-06 NOTE — Telephone Encounter (Signed)
 Pt has NEW PT APPT 05/02/24 PREOP CLEARANCE WITH Dr. Mallipeddi.   I will update all parties involved.

## 2024-04-13 ENCOUNTER — Encounter: Payer: Self-pay | Admitting: Internal Medicine

## 2024-04-13 ENCOUNTER — Telehealth: Payer: Self-pay | Admitting: Internal Medicine

## 2024-04-13 ENCOUNTER — Ambulatory Visit: Admitting: Internal Medicine

## 2024-04-13 VITALS — BP 126/104 | HR 71 | Ht 70.0 in | Wt 172.8 lb

## 2024-04-13 DIAGNOSIS — R0609 Other forms of dyspnea: Secondary | ICD-10-CM | POA: Diagnosis not present

## 2024-04-13 DIAGNOSIS — R079 Chest pain, unspecified: Secondary | ICD-10-CM | POA: Insufficient documentation

## 2024-04-13 DIAGNOSIS — I2510 Atherosclerotic heart disease of native coronary artery without angina pectoris: Secondary | ICD-10-CM

## 2024-04-13 DIAGNOSIS — Z01810 Encounter for preprocedural cardiovascular examination: Secondary | ICD-10-CM | POA: Diagnosis not present

## 2024-04-13 NOTE — Patient Instructions (Signed)
 Medication Instructions:  Your physician recommends that you continue on your current medications as directed. Please refer to the Current Medication list given to you today.   Labwork: None  Testing/Procedures: Your physician has requested that you have an echocardiogram. Echocardiography is a painless test that uses sound waves to create images of your heart. It provides your doctor with information about the size and shape of your heart and how well your heart's chambers and valves are working. This procedure takes approximately one hour. There are no restrictions for this procedure. Please do NOT wear cologne, perfume, aftershave, or lotions (deodorant is allowed). Please arrive 15 minutes prior to your appointment time.  Please note: We ask at that you not bring children with you during ultrasound (echo/ vascular) testing. Due to room size and safety concerns, children are not allowed in the ultrasound rooms during exams. Our front office staff cannot provide observation of children in our lobby area while testing is being conducted. An adult accompanying a patient to their appointment will only be allowed in the ultrasound room at the discretion of the ultrasound technician under special circumstances. We apologize for any inconvenience.  Your physician has requested that you have a lexiscan myoview. For further information please visit [email or web address] Please follow instruction sheet, as given.   Follow-Up: Your physician recommends that you schedule a follow-up appointment in: 6 months  Any Other Special Instructions Will Be Listed Below (If Applicable). Thank you for choosing [facility]!     If you need a refill on your cardiac medications before your next appointment, please call your pharmacy.

## 2024-04-13 NOTE — Progress Notes (Signed)
 Cardiology Office Note  Date: 04/13/2024   ID: Albert [name], DOB 09-Mar-1946, MRN [id number]  PCP:  Teresa Jenkins Jansky, FNP  Cardiologist:  None Electrophysiologist:  None   History of Present Illness: Albert [name] is a 78 y.o. male known to have HTN, HLD was referred to cardiology clinic for preop clearance.  He has hiatal hernia and will need hernia repair.  He also has dysphagia.  He reports having new onset of exertional chest pain and DOE in the last 1 month.  This is new according to him.  He never had any prior ischemia evaluation.  No prior MI/PCI/CABG.   Past Medical History:  Diagnosis Date   Anxiety    Depression    Diabetes (HCC)    type 2   Dizziness    GERD (gastroesophageal reflux disease)    Heart murmur    Hyperlipidemia    Hypertension    Myocardial infarction [facility]) 2008   Neuropathy    left foot   Spinal stenosis    Stroke [facility]) 2008    Past Surgical History:  Procedure Laterality Date   APPENDECTOMY  1980   ESOPHAGOGASTRODUODENOSCOPY N/A 03/13/2024   Procedure: EGD (ESOPHAGOGASTRODUODENOSCOPY);  Surgeon: Cindie Carlin POUR, DO;  Location: [facility];  Service: Endoscopy;  Laterality: N/A;  130pm, asa 3    Current Outpatient Medications  Medication Sig Dispense Refill   amLODipine  (NORVASC ) 10 MG tablet Take 1 tablet (10 mg total) by mouth daily. 90 tablet 1   aspirin 81 MG tablet Take 160 mg by mouth 2 (two) times daily.      gabapentin (NEURONTIN) 100 MG capsule Take 100 mg by mouth 3 (three) times daily.     lisinopril -hydrochlorothiazide (ZESTORETIC) 20-25 MG tablet Take 1 tablet by mouth daily.     LORazepam  (ATIVAN ) 0.5 MG tablet Take 1 tablet (0.5 mg total) by mouth 2 (two) times daily. 60 tablet 2   Misc. Devices (QUAD CANE) MISC Use to balance and to prevent falls (Dx R29.6 - high fall risk) 1 each 0   Multiple Vitamins-Minerals (MULTIVITAMIN ADULT PO) Take 1 tablet by mouth daily.     ondansetron  (ZOFRAN ) 4 MG tablet 4 mg as needed.      ondansetron  (ZOFRAN ) 4 MG tablet Take 1 tablet (4 mg total) by mouth every 8 (eight) hours as needed for nausea or vomiting. 6 tablet 0   pantoprazole  (PROTONIX ) 40 MG tablet Take 1 tablet (40 mg total) by mouth 2 (two) times daily. 180 tablet 3   pravastatin  (PRAVACHOL ) 40 MG tablet Take 1 tablet (40 mg total) by mouth daily. 90 tablet 1   propranolol (INDERAL) 20 MG tablet Take 20 mg by mouth.     sertraline  (ZOLOFT ) 100 MG tablet Take 2 tablets (200 mg total) by mouth daily. 180 tablet 0   sucralfate  (CARAFATE ) 1 g tablet Take 1 tablet (1 g total) by mouth with breakfast, with lunch, and with evening meal for 7 days. 21 tablet 0   temazepam (RESTORIL) 15 MG capsule 15 mg daily.     TYLENOL 500 MG tablet Take 1 tablet by mouth 2 (two) times daily.     No current facility-administered medications for this visit.   Allergies:  Ciprofloxacin, Diovan [valsartan], Hydrocodone, and Penicillins   Social History: The patient  reports that he has never smoked. He has never used smokeless tobacco. He reports that he does not drink alcohol  and does not use drugs.   Family History: The  patient's family history includes Cancer in his mother; Heart attack in his father, mother, sister, and sister; Heart disease in his brother, father, and mother; Hypertension in his brother and mother.   ROS:  Please see the history of present illness. Otherwise, complete review of systems is positive for none  All other systems are reviewed and negative.   Physical Exam: VS:  BP (!) 126/104   Pulse 71   Ht 5' 10 (1.778 m)   Wt 172 lb 12.8 oz (78.4 kg)   SpO2 95%   BMI 24.79 kg/m , BMI Body mass index is 24.79 kg/m.  Wt Readings from Last 3 Encounters:  04/13/24 172 lb 12.8 oz (78.4 kg)  03/18/24 176 lb 9.4 oz (80.1 kg)  03/13/24 176 lb 9.4 oz (80.1 kg)    General: Patient appears comfortable at rest. HEENT: Conjunctiva and lids normal, oropharynx clear with moist mucosa. Neck: Supple, no elevated JVP or  carotid bruits, no thyromegaly. Lungs: Clear to auscultation, nonlabored breathing at rest. Cardiac: Regular rate and rhythm, no S3 or significant systolic murmur, no pericardial rub. Abdomen: Soft, nontender, no hepatomegaly, bowel sounds present, no guarding or rebound. Extremities: No pitting edema, distal pulses 2+. Skin: Warm and dry. Musculoskeletal: No kyphosis. Neuropsychiatric: Alert and oriented x3, affect grossly appropriate.  Recent Labwork: 03/18/2024: ALT 26; AST 24; BUN 35; Creatinine, Ser 1.60; Hemoglobin 14.6; Platelets 216; Potassium 3.9; Sodium 138     Component Value Date/Time   CHOL 158 04/23/2015 1429   TRIG 113 04/23/2015 1429   TRIG 94 07/05/2014 1044   HDL 65 04/23/2015 1429   HDL 65 07/05/2014 1044   CHOLHDL 2.4 04/23/2015 1429   CHOLHDL 2.9 11/10/2006 1645   VLDL 17 11/10/2006 1645   LDLCALC 70 04/23/2015 1429   LDLCALC 160 (H) 12/07/2013 1225    Other Studies Reviewed Today:   Assessment and Plan:  Preop cardiac risk stratification for hernia repair - Patient reports exertional chest pain and DOE for the last 1 month.  New according to him.  Obtain echocardiogram and Lexiscan .  Cardiac risk factors include HTN.  Preop recommendations pending results.  Patient wants to get these test done in the next couple of days.  Cardiac chest pain DOE - Obtain echocardiogram and Lexiscan , as above.  History of bradycardia - Resolved in 2017 after stopping atenolol .  Currently not on AV nodal agents.  HTN, controlled - continue current antihypertensives.     40 minutes spent in reviewing prior medical records, reports, more than 3 labs, discussion and documentation  Medication Adjustments/Labs and Tests Ordered: Current medicines are reviewed at length with the patient today.  Concerns regarding medicines are outlined above.    Disposition:  Follow up 6 months  Signed Razi Hickle Priya Debhora Titus, MD, 04/13/2024 3:08 PM    [facility]  HeartCare at [facility] [address] [place], [place], [place] [address]

## 2024-04-13 NOTE — Telephone Encounter (Signed)
 Checking percert on the following patient for testing scheduled at [facility].    LEXISCAN  -04/24/2024

## 2024-04-18 ENCOUNTER — Ambulatory Visit: Payer: Self-pay | Admitting: Internal Medicine

## 2024-04-24 ENCOUNTER — Encounter (HOSPITAL_COMMUNITY): Payer: Self-pay

## 2024-04-24 ENCOUNTER — Encounter (HOSPITAL_BASED_OUTPATIENT_CLINIC_OR_DEPARTMENT_OTHER)
Admission: RE | Admit: 2024-04-24 | Discharge: 2024-04-24 | Disposition: A | Discharge status: Home / Self Care | Source: Ambulatory Visit | Attending: Internal Medicine | Admitting: Internal Medicine

## 2024-04-24 ENCOUNTER — Other Ambulatory Visit: Payer: Self-pay | Admitting: Physician Assistant

## 2024-04-24 ENCOUNTER — Encounter (HOSPITAL_COMMUNITY)
Admission: RE | Admit: 2024-04-24 | Discharge: 2024-04-24 | Disposition: A | Discharge status: Home / Self Care | Source: Ambulatory Visit | Attending: Internal Medicine | Admitting: Internal Medicine

## 2024-04-24 DIAGNOSIS — R0609 Other forms of dyspnea: Secondary | ICD-10-CM | POA: Diagnosis not present

## 2024-04-24 DIAGNOSIS — R079 Chest pain, unspecified: Secondary | ICD-10-CM

## 2024-04-24 LAB — NM MYOCAR MULTI W/SPECT W/WALL MOTION / EF
Base ST Depression (mm): 0 mm
LV dias vol: 61 mL (ref 62–150)
LV sys vol: 20 mL
MPHR: 144 {beats}/min
Nuc Stress EF: 68 %
Peak HR: 88 {beats}/min
Percent HR: 61 %
RATE: 0.5
Rest HR: 68 {beats}/min
Rest Nuclear Isotope Dose: 10.7 mCi
SDS: 6
SRS: 4
SSS: 10
ST Depression (mm): 0 mm
Stress Nuclear Isotope Dose: 30.5 mCi
TID: 0.83

## 2024-04-24 MED ORDER — REGADENOSON 0.4 MG/5ML IV SOLN
INTRAVENOUS | Status: AC
  Administered 2024-04-24: 0.4 mg via INTRAVENOUS
  Filled 2024-04-24: qty 5

## 2024-04-24 MED ORDER — SODIUM CHLORIDE FLUSH 0.9 % IV SOLN
INTRAVENOUS | Status: AC
  Administered 2024-04-24: 10 mL via INTRAVENOUS
  Filled 2024-04-24: qty 10

## 2024-04-24 MED ORDER — TECHNETIUM TC 99M TETROFOSMIN IV KIT
10.0000 | PACK | Freq: Once | INTRAVENOUS | Status: AC | PRN
  Administered 2024-04-24: 10.7 via INTRAVENOUS

## 2024-04-24 MED ORDER — TECHNETIUM TC 99M TETROFOSMIN IV KIT
30.0000 | PACK | Freq: Once | INTRAVENOUS | Status: AC | PRN
  Administered 2024-04-24: 30.5 via INTRAVENOUS

## 2024-04-24 NOTE — Progress Notes (Signed)
" °  ° °  Albert [name] presented for a Lexiscan  nuclear stress test today.  I Albert CINDERELLA Kapur, PA-C, provided direct supervision and was present during the stress portion of the study today, which was completed without significant symptoms, immediate complications, or acute ST/T changes on ECG.  Stress imaging is pending at this time.  Preliminary ECG findings may be listed in the chart, but the stress test result will not be finalized until perfusion imaging is complete.  Albert CINDERELLA Kapur, PA-C  04/24/2024, 9:32 AM    "

## 2024-04-25 ENCOUNTER — Ambulatory Visit: Payer: Self-pay | Admitting: Internal Medicine

## 2024-04-27 ENCOUNTER — Encounter (HOSPITAL_COMMUNITY): Payer: Self-pay | Admitting: Emergency Medicine

## 2024-04-27 ENCOUNTER — Emergency Department (HOSPITAL_COMMUNITY)
Admission: EM | Admit: 2024-04-27 | Discharge: 2024-04-28 | Disposition: A | Discharge status: Home / Self Care | Source: Home / Self Care | Attending: Emergency Medicine | Admitting: Emergency Medicine

## 2024-04-27 ENCOUNTER — Other Ambulatory Visit: Payer: Self-pay

## 2024-04-27 ENCOUNTER — Emergency Department (HOSPITAL_COMMUNITY)

## 2024-04-27 DIAGNOSIS — R1010 Upper abdominal pain, unspecified: Secondary | ICD-10-CM | POA: Diagnosis present

## 2024-04-27 DIAGNOSIS — K449 Diaphragmatic hernia without obstruction or gangrene: Secondary | ICD-10-CM | POA: Insufficient documentation

## 2024-04-27 DIAGNOSIS — Z7982 Long term (current) use of aspirin: Secondary | ICD-10-CM | POA: Diagnosis not present

## 2024-04-27 DIAGNOSIS — R0609 Other forms of dyspnea: Secondary | ICD-10-CM | POA: Diagnosis not present

## 2024-04-27 HISTORY — DX: Diaphragmatic hernia without obstruction or gangrene: K44.9

## 2024-04-27 LAB — COMPREHENSIVE METABOLIC PANEL WITH GFR
ALT: 29 U/L (ref 0–44)
AST: 26 U/L (ref 15–41)
Albumin: 4.3 g/dL (ref 3.5–5.0)
Alkaline Phosphatase: 68 U/L (ref 38–126)
Anion gap: 14 (ref 5–15)
BUN: 38 mg/dL — ABNORMAL HIGH (ref 8–23)
CO2: 24 mmol/L (ref 22–32)
Calcium: 9.6 mg/dL (ref 8.9–10.3)
Chloride: 100 mmol/L (ref 98–111)
Creatinine, Ser: 1.48 mg/dL — ABNORMAL HIGH (ref 0.61–1.24)
GFR, Estimated: 49 mL/min — ABNORMAL LOW
Glucose, Bld: 90 mg/dL (ref 70–99)
Potassium: 4.2 mmol/L (ref 3.5–5.1)
Sodium: 138 mmol/L (ref 135–145)
Total Bilirubin: 0.3 mg/dL (ref 0.0–1.2)
Total Protein: 7.6 g/dL (ref 6.5–8.1)

## 2024-04-27 LAB — CBC
HCT: 44.2 % (ref 39.0–52.0)
Hemoglobin: 14.5 g/dL (ref 13.0–17.0)
MCH: 29 pg (ref 26.0–34.0)
MCHC: 32.8 g/dL (ref 30.0–36.0)
MCV: 88.4 fL (ref 80.0–100.0)
Platelets: 197 K/uL (ref 150–400)
RBC: 5 MIL/uL (ref 4.22–5.81)
RDW: 13.2 % (ref 11.5–15.5)
WBC: 7.5 K/uL (ref 4.0–10.5)
nRBC: 0 % (ref 0.0–0.2)

## 2024-04-27 LAB — LIPASE, BLOOD: Lipase: 49 U/L (ref 11–51)

## 2024-04-27 LAB — TROPONIN T, HIGH SENSITIVITY: Troponin T High Sensitivity: <15 ng/L (ref 0–19)

## 2024-04-27 MED ORDER — LIDOCAINE VISCOUS HCL 2 % MT SOLN
15.0000 mL | Freq: Once | OROMUCOSAL | Status: AC
  Administered 2024-04-27: 15 mL via OROMUCOSAL
  Filled 2024-04-27: qty 15

## 2024-04-27 MED ORDER — ONDANSETRON HCL 4 MG/2ML IJ SOLN
4.0000 mg | Freq: Once | INTRAMUSCULAR | Status: AC
  Administered 2024-04-27: 4 mg via INTRAVENOUS
  Filled 2024-04-27: qty 2

## 2024-04-27 MED ORDER — ALUM & MAG HYDROXIDE-SIMETH 200-200-20 MG/5ML PO SUSP
30.0000 mL | Freq: Once | ORAL | Status: AC
  Administered 2024-04-27: 30 mL via ORAL
  Filled 2024-04-27: qty 30

## 2024-04-27 MED ORDER — MORPHINE SULFATE (PF) 4 MG/ML IV SOLN
4.0000 mg | Freq: Once | INTRAVENOUS | Status: AC
  Administered 2024-04-27: 4 mg via INTRAVENOUS
  Filled 2024-04-27: qty 1

## 2024-04-27 NOTE — ED Triage Notes (Addendum)
 Pt arrives via [facility] from home c/o abdominal/Chest pain that's occurred for 2 weeks now. LBM today. Endorses n/v/d; cough, headache. Pt states blood in stool.   Pt is HOH with hearing aids at home.  Hx of hiatal hernia  EKG done in triage with NSR

## 2024-04-27 NOTE — ED Provider Notes (Signed)
 "  EMERGENCY DEPARTMENT AT [facility] Provider Note   CSN: [id number] Arrival date & time: 04/27/24  2147     Patient presents with: Abdominal Pain   Albert [name] is a 79 y.o. male.  {Add pertinent medical, surgical, social history, OB history to YEP:67052} Patient presents to the emergency department for evaluation of upper abdominal/lower chest pain.  Patient reports that this has been occurring intermittently and he has been told that it is secondary to his hiatal hernia.  He is currently undergoing preoperative clearance for repair of his hiatal hernia.  Patient reports that he developed the pain again tonight.  He has had nausea, vomiting and some diarrhea.       Prior to Admission medications  Medication Sig Start Date End Date Taking? Authorizing Provider  amLODipine  (NORVASC ) 10 MG tablet Take 1 tablet (10 mg total) by mouth daily. 04/23/15   Gladis Mary-Margaret, FNP  aspirin 81 MG tablet Take 160 mg by mouth 2 (two) times daily.     [provider]  gabapentin (NEURONTIN) 100 MG capsule Take 100 mg by mouth 3 (three) times daily.    [provider]  lisinopril -hydrochlorothiazide (ZESTORETIC) 20-25 MG tablet Take 1 tablet by mouth daily. 03/30/24   [provider]  LORazepam  (ATIVAN ) 0.5 MG tablet Take 1 tablet (0.5 mg total) by mouth 2 (two) times daily. 06/30/16   Vickey Mettle, MD  Misc. Devices (QUAD CANE) MISC Use to balance and to prevent falls (Dx R29.6 - high fall risk) 01/25/15   Gladis Mary-Margaret, FNP  Multiple Vitamins-Minerals (MULTIVITAMIN ADULT PO) Take 1 tablet by mouth daily. 10/01/15   [provider]  ondansetron  (ZOFRAN ) 4 MG tablet 4 mg as needed. 11/10/18   [provider]  ondansetron  (ZOFRAN ) 4 MG tablet Take 1 tablet (4 mg total) by mouth every 8 (eight) hours as needed for nausea or vomiting. 03/18/24   Elnor Jayson LABOR, DO  pantoprazole  (PROTONIX ) 40 MG tablet Take 1 tablet (40 mg total) by  mouth 2 (two) times daily. 03/13/24 03/13/25  Cindie Carlin POUR, DO  pravastatin  (PRAVACHOL ) 40 MG tablet Take 1 tablet (40 mg total) by mouth daily. 04/23/15   Gladis Mary-Margaret, FNP  propranolol (INDERAL) 20 MG tablet Take 20 mg by mouth. 02/05/24   [provider]  sertraline  (ZOLOFT ) 100 MG tablet Take 2 tablets (200 mg total) by mouth daily. 06/30/16   Vickey Mettle, MD  sucralfate  (CARAFATE ) 1 g tablet Take 1 tablet (1 g total) by mouth with breakfast, with lunch, and with evening meal for 7 days. 03/18/24 04/13/24  Elnor Jayson A, DO  temazepam (RESTORIL) 15 MG capsule 15 mg daily. 08/16/18   [provider]  TYLENOL 500 MG tablet Take 1 tablet by mouth 2 (two) times daily. 08/26/14   [provider]    Allergies: Ciprofloxacin, Diovan [valsartan], Hydrocodone, and Penicillins    Review of Systems  Updated Vital Signs BP 137/89 (BP Location: Right Arm)   Pulse 65   Temp 98 F (36.7 C) (Oral)   Resp 18   SpO2 94%   Physical Exam Vitals and nursing note reviewed.  Constitutional:      General: He is not in acute distress.    Appearance: He is well-developed.  HENT:     Head: Normocephalic and atraumatic.     Mouth/Throat:     Mouth: Mucous membranes are moist.  Eyes:     General: Vision grossly intact. Gaze aligned appropriately.  Extraocular Movements: Extraocular movements intact.     Conjunctiva/sclera: Conjunctivae normal.  Cardiovascular:     Rate and Rhythm: Normal rate and regular rhythm.     Pulses: Normal pulses.     Heart sounds: Normal heart sounds, S1 normal and S2 normal. No murmur heard.    No friction rub. No gallop.  Pulmonary:     Effort: Pulmonary effort is normal. No respiratory distress.     Breath sounds: Normal breath sounds.  Abdominal:     Palpations: Abdomen is soft.     Tenderness: There is abdominal tenderness in the epigastric area. There is no guarding or rebound.     Hernia: No hernia is present.   Musculoskeletal:        General: No swelling.     Cervical back: Full passive range of motion without pain, normal range of motion and neck supple. No pain with movement, spinous process tenderness or muscular tenderness. Normal range of motion.     Right lower leg: No edema.     Left lower leg: No edema.  Skin:    General: Skin is warm and dry.     Capillary Refill: Capillary refill takes less than 2 seconds.     Findings: No ecchymosis, erythema, lesion or wound.  Neurological:     Mental Status: He is alert and oriented to person, place, and time.     GCS: GCS eye subscore is 4. GCS verbal subscore is 5. GCS motor subscore is 6.     Cranial Nerves: Cranial nerves 2-12 are intact.     Sensory: Sensation is intact.     Motor: Motor function is intact. No weakness or abnormal muscle tone.     Coordination: Coordination is intact.  Psychiatric:        Mood and Affect: Mood normal.        Speech: Speech normal.        Behavior: Behavior normal.     (all labs ordered are listed, but only abnormal results are displayed) Labs Reviewed  COMPREHENSIVE METABOLIC PANEL WITH GFR - Abnormal; Notable for the following components:      Result Value   BUN 38 (*)    Creatinine, Ser 1.48 (*)    GFR, Estimated 49 (*)    All other components within normal limits  LIPASE, BLOOD  CBC  URINALYSIS, ROUTINE W REFLEX MICROSCOPIC  TROPONIN T, HIGH SENSITIVITY    EKG: None  Radiology: No results found.  {Document cardiac monitor, telemetry assessment procedure when appropriate:32947} Procedures   Medications Ordered in the ED - No data to display    {Click here for ABCD2, HEART and other calculators REFRESH Note before signing:1}                              Medical Decision Making Amount and/or Complexity of Data Reviewed Labs: ordered.   ***  {Document critical care time when appropriate  Document review of labs and clinical decision tools ie CHADS2VASC2, etc  Document your  independent review of radiology images and any outside records  Document your discussion with family members, caretakers and with consultants  Document social determinants of health affecting pt's care  Document your decision making why or why not admission, treatments were needed:32947:::1}   Final diagnoses:  None    ED Discharge Orders     None        "

## 2024-04-28 LAB — URINALYSIS, ROUTINE W REFLEX MICROSCOPIC
Bacteria, UA: NONE SEEN
Bilirubin Urine: NEGATIVE
Glucose, UA: NEGATIVE mg/dL
Hgb urine dipstick: NEGATIVE
Ketones, ur: NEGATIVE mg/dL
Leukocytes,Ua: NEGATIVE
Nitrite: POSITIVE — AB
Protein, ur: NEGATIVE mg/dL
Specific Gravity, Urine: 1.01 (ref 1.005–1.030)
pH: 5 (ref 5.0–8.0)

## 2024-04-28 LAB — TROPONIN T, HIGH SENSITIVITY: Troponin T High Sensitivity: <15 ng/L (ref 0–19)

## 2024-04-28 MED ORDER — FAMOTIDINE 20 MG PO TABS
40.0000 mg | ORAL_TABLET | Freq: Once | ORAL | Status: AC
  Administered 2024-04-28: 40 mg via ORAL
  Filled 2024-04-28: qty 2

## 2024-05-02 ENCOUNTER — Encounter: Payer: Self-pay | Admitting: Gastroenterology

## 2024-05-02 ENCOUNTER — Ambulatory Visit: Admitting: Internal Medicine

## 2024-05-03 ENCOUNTER — Ambulatory Visit: Attending: Internal Medicine

## 2024-05-03 ENCOUNTER — Ambulatory Visit: Admitting: Cardiovascular Disease

## 2024-05-03 DIAGNOSIS — R079 Chest pain, unspecified: Secondary | ICD-10-CM | POA: Diagnosis not present

## 2024-05-03 DIAGNOSIS — R0609 Other forms of dyspnea: Secondary | ICD-10-CM | POA: Diagnosis not present

## 2024-05-04 LAB — ECHOCARDIOGRAM COMPLETE
AR max vel: 2.32 cm2
AV Peak grad: 7.2 mmHg
Ao pk vel: 1.34 m/s
Area-P 1/2: 3.15 cm2
Calc EF: 62.8 %
S' Lateral: 1.9 cm
Single Plane A2C EF: 65.3 %
Single Plane A4C EF: 57.9 %

## 2024-05-05 NOTE — Telephone Encounter (Signed)
-----   Message from Vishnu Mallipeddi, MD sent at 05/04/2024  1:36 PM EST ----- Normal LV function, G1 DD with elevated LVEDP and CVP 3 mmHg, normal RV function, mild pulmonary hypertension, trivial MR with mean PG 2 mmHg and no other significant valvular heart disease.   Overall, no significant abnormalities on echocardiogram.  Low risk for any perioperative cardiac complications (for hernia repair surgery).  He had normal stress test and normal echocardiogram.  Please send these recommendations to his surgeon.

## 2024-05-05 NOTE — Telephone Encounter (Signed)
 The patient has been notified of the result and verbalized understanding.  All questions (if any) were answered. Littie CHRISTELLA Croak, CMA 05/05/2024 9:10 AM

## 2024-05-16 ENCOUNTER — Telehealth: Payer: Self-pay | Admitting: Cardiology

## 2024-05-16 NOTE — Telephone Encounter (Signed)
"  ° °  Patient Name: Albert [name]  DOB: [birth year] MRN: [id number]  Primary Cardiologist: Vishnu P Mallipeddi, MD  Chart reviewed as part of pre-operative protocol coverage. Given past medical history and time since last visit, based on ACC/AHA guidelines, Albert [name] is at acceptable risk for the planned procedure without further cardiovascular testing.   Per Dr. Mallipeddi on 05/03/2024: Low risk for any perioperative cardiac complications (for hernia repair surgery). He had normal stress test and normal echocardiogram. Please send these recommendations to his surgeon.  I will route this recommendation to the requesting party via Epic fax function and remove from pre-op pool.  Please call with questions.  Albert LITTIE Louis, NP 05/16/2024, 3:29 PM  "

## 2024-05-16 NOTE — Telephone Encounter (Addendum)
 Requesting office sent duplicate inquiring if pt has been cleared. I see in new pt appt with DR. Mallipeddi cardiac testing was ordered.   Looks like testing has been completed. Will reach out to the preop APP to review if pt has been cleared.

## 2024-05-16 NOTE — Telephone Encounter (Signed)
" ° °  Pre-operative Risk Assessment    Patient Name: Albert [name]  DOB: 03/08/1946 MRN: [id number]{     Request for Surgical Clearance    Procedure:  hIATAL HERNIA REPAIR   Date of Surgery:  Clearance TBD                                 Surgeon:  CAMELLIA BLUSH, M.D. Surgeon's Group or Practice Name:  [facility] Phone number:  [phone] Fax number:  [phone]   Type of Clearance Requested:   - Medical    Type of Anesthesia:  Not Indicated   Additional requests/questions:    Bonney Andres ONEIDA Delbra   05/16/2024, 5:37 PM  "

## 2024-05-17 NOTE — Telephone Encounter (Signed)
 Duplicate clearance request, already addressed in 04/05/24 phone note - finalized recommendations sent by Lum Louis NP yesterday. Will remove this from preop box.

## 2024-06-15 ENCOUNTER — Ambulatory Visit: Admitting: Internal Medicine

## 2024-07-26 ENCOUNTER — Ambulatory Visit: Admitting: Internal Medicine
